# Patient Record
Sex: Female | Born: 1980 | Race: White | Hispanic: No | Marital: Married | Smoking: Never smoker
Health system: Southern US, Community
[De-identification: ages and names within clinical notes are randomized; demographics above are authoritative.]

## PROBLEM LIST (undated history)

## (undated) DIAGNOSIS — F32A Depression, unspecified: Secondary | ICD-10-CM

## (undated) DIAGNOSIS — F419 Anxiety disorder, unspecified: Secondary | ICD-10-CM

## (undated) HISTORY — PX: HERNIA REPAIR: SHX51

## (undated) HISTORY — DX: Depression, unspecified: F32.A

## (undated) HISTORY — DX: Anxiety disorder, unspecified: F41.9

---

## 2019-03-08 NOTE — L&D Delivery Note (Signed)
Delivery Note Called to room and patient complete and pushing. At 8:17 AM a viable female was delivered via Vaginal, Spontaneous (Presentation:   Occiput Anterior).  APGAR: 8, 9; weight pending.   Placenta status: Spontaneous, Intact.  Cord: 3 vessels with the following complications: None.   Anesthesia: Epidural Episiotomy: None Lacerations: bilateral deep Sulcus Suture Repair: 3.0 vicryl Est. Blood Loss (mL): 610  TXA given postpartum given continued bleeding.  Mom to postpartum.  Baby to Couplet care / Skin to Skin.  Alric Seton 03/02/2020, 8:44 AM

## 2019-07-02 LAB — OB RESULTS CONSOLE PLATELET COUNT: Platelets: 246

## 2019-07-02 LAB — OB RESULTS CONSOLE RPR: RPR: NONREACTIVE

## 2019-07-02 LAB — OB RESULTS CONSOLE HIV ANTIBODY (ROUTINE TESTING): HIV: NONREACTIVE

## 2019-07-02 LAB — OB RESULTS CONSOLE ABO/RH: RH Type: POSITIVE

## 2019-07-02 LAB — OB RESULTS CONSOLE HEPATITIS B SURFACE ANTIGEN: Hepatitis B Surface Ag: NEGATIVE

## 2019-07-02 LAB — OB RESULTS CONSOLE HGB/HCT, BLOOD
HCT: 47 — AB (ref 29–41)
Hemoglobin: 15.2

## 2019-07-02 LAB — OB RESULTS CONSOLE GC/CHLAMYDIA
Chlamydia: NEGATIVE
Gonorrhea: NEGATIVE

## 2019-07-02 LAB — OB RESULTS CONSOLE RUBELLA ANTIBODY, IGM: Rubella: IMMUNE

## 2019-07-02 LAB — OB RESULTS CONSOLE VARICELLA ZOSTER ANTIBODY, IGG: Varicella: IMMUNE

## 2019-07-02 LAB — HEPATITIS C ANTIBODY: HCV Ab: NEGATIVE

## 2019-07-02 LAB — CYSTIC FIBROSIS DIAGNOSTIC STUDY: Interpretation-CFDNA:: NEGATIVE

## 2019-07-02 LAB — OB RESULTS CONSOLE ANTIBODY SCREEN: Antibody Screen: NEGATIVE

## 2019-12-05 LAB — GLUCOSE TOLERANCE, 1 HOUR: Glucose, 1 Hour GTT: 167

## 2019-12-16 LAB — GLUCOSE TOLERANCE, 3 HOURS
Glucose, GTT - 1 Hour: 96 (ref ?–200)
Glucose, GTT - 2 Hour: 92 (ref ?–140)
Glucose, GTT - 3 Hour: 75 mg/dL (ref ?–140)
Glucose, GTT - Fasting: 78 mg/dL — AB (ref 80–110)

## 2019-12-16 LAB — OB RESULTS CONSOLE HGB/HCT, BLOOD
HCT: 39 (ref 29–41)
Hemoglobin: 12.8

## 2020-01-08 ENCOUNTER — Encounter: Payer: Self-pay | Admitting: *Deleted

## 2020-01-14 ENCOUNTER — Encounter: Payer: Self-pay | Admitting: General Practice

## 2020-01-16 ENCOUNTER — Encounter: Payer: Self-pay | Admitting: *Deleted

## 2020-01-22 ENCOUNTER — Encounter: Payer: Self-pay | Admitting: *Deleted

## 2020-01-22 ENCOUNTER — Ambulatory Visit (INDEPENDENT_AMBULATORY_CARE_PROVIDER_SITE_OTHER): Payer: Medicaid Other | Admitting: Obstetrics & Gynecology

## 2020-01-22 ENCOUNTER — Encounter: Payer: Self-pay | Admitting: Obstetrics & Gynecology

## 2020-01-22 ENCOUNTER — Other Ambulatory Visit: Payer: Self-pay

## 2020-01-22 VITALS — BP 113/86 | HR 100 | Ht 64.0 in | Wt 178.0 lb

## 2020-01-22 DIAGNOSIS — O09899 Supervision of other high risk pregnancies, unspecified trimester: Secondary | ICD-10-CM | POA: Insufficient documentation

## 2020-01-22 DIAGNOSIS — O09523 Supervision of elderly multigravida, third trimester: Secondary | ICD-10-CM | POA: Diagnosis not present

## 2020-01-22 DIAGNOSIS — F429 Obsessive-compulsive disorder, unspecified: Secondary | ICD-10-CM | POA: Insufficient documentation

## 2020-01-22 DIAGNOSIS — Z98891 History of uterine scar from previous surgery: Secondary | ICD-10-CM | POA: Insufficient documentation

## 2020-01-22 DIAGNOSIS — O0990 Supervision of high risk pregnancy, unspecified, unspecified trimester: Secondary | ICD-10-CM | POA: Diagnosis not present

## 2020-01-22 DIAGNOSIS — F419 Anxiety disorder, unspecified: Secondary | ICD-10-CM | POA: Insufficient documentation

## 2020-01-22 DIAGNOSIS — O34219 Maternal care for unspecified type scar from previous cesarean delivery: Secondary | ICD-10-CM

## 2020-01-22 NOTE — Progress Notes (Signed)
  Subjective:Transfer from [facility]    Candice Downs is a [id number] [redacted]w[redacted]d being seen today for her first obstetrical visit.  Her obstetrical history is significant for advanced maternal age and previous cesarean for breech. Patient does intend to breast feed. Pregnancy history fully reviewed.  Patient reports no complaints.  Vitals:   01/22/20 0957 01/22/20 0958  BP: 113/86   Pulse: 100   Weight: 178 lb (80.7 kg)   Height:  5\' 4"  (1.626 m)    HISTORY: OB History  Gravida Para Term Preterm AB Living  4 1 1  0 2 1  SAB TAB Ectopic Multiple Live Births  2 0 0 0 2    # Outcome Date GA Lbr Len/2nd Weight Sex Delivery Anes PTL Lv  4 Current           3 SAB 01/2018 [redacted]w[redacted]d         2 SAB 09/2017 [redacted]w[redacted]d         1 Term      CS-LTranv      Past Medical History:  Diagnosis Date  . Anxiety   . Depression    History reviewed. No pertinent surgical history. History reviewed. No pertinent family history.   Exam    Uterus:     Pelvic Exam:                                    Skin: normal coloration and turgor, no rashes    Neurologic: oriented, normal mood   Extremities: normal strength, tone, and muscle mass   HEENT PERRLA       Neck supple   Cardiovascular: regular rate and rhythm   Respiratory:  appears well, vitals normal, no respiratory distress, acyanotic, normal RR   Abdomen: gravid          Assessment:    Pregnancy: 10/2017 Patient Active Problem List   Diagnosis Date Noted  . Supervision of high risk pregnancy, antepartum 01/22/2020  . Short interval between pregnancies affecting pregnancy, antepartum 01/22/2020  . Anxiety 01/22/2020  . OCD (obsessive compulsive disorder) 01/22/2020        Plan:     Initial labs reviewed Prenatal vitamins. Problem list reviewed and updated. Genetic Screening discussed results reviewed.  Ultrasound discussed; fetal survey: ordered.  Follow up in 2 weeks. 50% of 30 min visit spent on counseling and coordination  of care.  Patient counseled repeat CS vs TOLAC and she signed consent for TOLAC   01/24/2020 01/22/2020

## 2020-01-22 NOTE — Patient Instructions (Signed)
Vaginal Birth After Cesarean Delivery  Vaginal birth after cesarean delivery (VBAC) is giving birth vaginally after previously delivering a baby through a cesarean section (C-section). A VBAC may be a safe option for you, depending on your health and other factors. It is important to discuss VBAC with your health care provider early in your pregnancy so you can understand the risks, benefits, and options. Having these discussions early will give you time to make your birth plan. Who are the best candidates for VBAC? The best candidates for VBAC are women who:  Have had one or two prior cesarean deliveries, and the incision made during the delivery was horizontal (low transverse).  Do not have a vertical (classical) scar on their uterus.  Have not had a tear in the wall of their uterus (uterine rupture).  Plan to have more pregnancies. A VBAC is also more likely to be successful:  In women who have previously given birth vaginally.  When labor starts by itself (spontaneously) before the due date. What are the benefits of VBAC? The benefits of delivering your baby vaginally instead of by a cesarean delivery include:  A shorter hospital stay.  A faster recovery time.  Less pain.  Avoiding risks associated with major surgery, such as infection and blood clots.  Less blood loss and less need for donated blood (transfusions). What are the risks of VBAC? The main risk of attempting a VBAC is that it may fail, forcing your health care provider to deliver your baby by a C-section. Other risks are rare and include:  Tearing (rupture) of the scar from a past cesarean delivery.  Other risks associated with vaginal deliveries. If a repeat cesarean delivery is needed, the risks include:  Blood loss.  Infection.  Blood clot.  Damage to surrounding organs.  Removal of the uterus (hysterectomy), if it is damaged.  Placenta problems in future pregnancies. What else should I know  about my options? Delivering a baby through a VBAC is similar to having a normal spontaneous vaginal delivery. Therefore, it is safe:  To try with twins.  For your health care provider to try to turn the baby from a breech position (external cephalic version) during labor.  With epidural analgesia for pain relief. Consider where you would like to deliver your baby. VBAC should be attempted in facilities where an emergency cesarean delivery can be performed. VBAC is not recommended for home births. Any changes in your health or your baby's health during your pregnancy may make it necessary to change your initial decision about VBAC. Your health care provider may recommend that you do not attempt a VBAC if:  Your baby's suspected weight is 8.8 lb (4 kg) or more.  You have preeclampsia. This is a condition that causes high blood pressure along with other symptoms, such as swelling and headaches.  You will have VBAC less than 19 months after your cesarean delivery.  You are past your due date.  You need to have labor started (induced) because your cervix is not ready for labor (unfavorable). Where to find more information  American Pregnancy Association: americanpregnancy.org  American Congress of Obstetricians and Gynecologists: acog.org Summary  Vaginal birth after cesarean delivery (VBAC) is giving birth vaginally after previously delivering a baby through a cesarean section (C-section). A VBAC may be a safe option for you, depending on your health and other factors.  Discuss VBAC with your health care provider early in your pregnancy so you can understand the risks, benefits, options, and   have plenty of time to make your birth plan.  The main risk of attempting a VBAC is that it may fail, forcing your health care provider to deliver your baby by a C-section. Other risks are rare. This information is not intended to replace advice given to you by your health care provider. Make sure  you discuss any questions you have with your health care provider. Document Revised: 06/19/2018 Document Reviewed: 05/31/2016 Elsevier Patient Education  2020 Elsevier Inc.  

## 2020-01-23 LAB — HIV ANTIBODY (ROUTINE TESTING W REFLEX): HIV Screen 4th Generation wRfx: NONREACTIVE

## 2020-01-23 LAB — RPR: RPR Ser Ql: NONREACTIVE

## 2020-02-06 ENCOUNTER — Ambulatory Visit (INDEPENDENT_AMBULATORY_CARE_PROVIDER_SITE_OTHER): Payer: Medicaid Other | Admitting: Nurse Practitioner

## 2020-02-06 ENCOUNTER — Other Ambulatory Visit: Payer: Self-pay

## 2020-02-06 ENCOUNTER — Other Ambulatory Visit (HOSPITAL_COMMUNITY)
Admission: RE | Admit: 2020-02-06 | Discharge: 2020-02-06 | Disposition: A | Discharge status: Home / Self Care | Payer: Medicaid Other | Source: Ambulatory Visit | Attending: Nurse Practitioner | Admitting: Nurse Practitioner

## 2020-02-06 VITALS — BP 121/86 | HR 89 | Wt 179.0 lb

## 2020-02-06 DIAGNOSIS — F419 Anxiety disorder, unspecified: Secondary | ICD-10-CM

## 2020-02-06 DIAGNOSIS — O09899 Supervision of other high risk pregnancies, unspecified trimester: Secondary | ICD-10-CM | POA: Diagnosis not present

## 2020-02-06 DIAGNOSIS — O0990 Supervision of high risk pregnancy, unspecified, unspecified trimester: Secondary | ICD-10-CM

## 2020-02-06 DIAGNOSIS — O34219 Maternal care for unspecified type scar from previous cesarean delivery: Secondary | ICD-10-CM

## 2020-02-06 NOTE — Progress Notes (Signed)
° ° °  Subjective:  Candice Downs is a 39 y.o. G4P1021 at [redacted]w[redacted]d being seen today for ongoing prenatal care.  She is currently monitored for the following issues for this high-risk pregnancy and has Supervision of high risk pregnancy, antepartum; Short interval between pregnancies affecting pregnancy, antepartum; Anxiety; OCD (obsessive compulsive disorder); Previous cesarean delivery affecting pregnancy; and Multigravida of advanced maternal age in third trimester on their problem list.  Patient reports no complaints.  Contractions: Irritability. Vag. Bleeding: None.  Movement: Present. Denies leaking of fluid.   The following portions of the patient's history were reviewed and updated as appropriate: allergies, current medications, past family history, past medical history, past social history, past surgical history and problem list. Problem list updated.  Objective:   Vitals:   02/06/20 1551  BP: 121/86  Pulse: 89  Weight: 179 lb (81.2 kg)    Fetal Status: Fetal Heart Rate (bpm): 136 Fundal Height: 36 cm Movement: Present     General:  Alert, oriented and cooperative. Patient is in no acute distress.  Skin: Skin is warm and dry. No rash noted.   Cardiovascular: Normal heart rate noted  Respiratory: Normal respiratory effort, no problems with respiration noted  Abdomen: Soft, gravid, appropriate for gestational age. Pain/Pressure: Absent     Pelvic:  Cervical exam deferred        Extremities: Normal range of motion.  Edema: None  Mental Status: Normal mood and affect. Normal behavior. Normal judgment and thought content.   Urinalysis:      Assessment and Plan:  Pregnancy: G4P1021 at [redacted]w[redacted]d  1. Supervision of high risk pregnancy, antepartum Vaginal swabs done Having some braxton hicks at night for an hour but then they resolve  2. Short interval between pregnancies affecting pregnancy, antepartum  - GC/Chlamydia probe amp ([place])not at [facility] - Strep Gp B Culture+Rflx  3.  Previous cesarean delivery affecting pregnancy Signed consent for VBAC  4. Anxiety On abilify and Prozac and was in her last pregnancy as well Has enough meds for now. Is seeing a family practice doctor tomorrow for her child and plans to go to that practice for her continued refills after the pregnancy Advised to be aware of risk of PP Depression and to call the office at any time if symptoms are worrisome for her.  Preterm labor symptoms and general obstetric precautions including but not limited to vaginal bleeding, contractions, leaking of fluid and fetal movement were reviewed in detail with the patient. Please refer to After Visit Summary for other counseling recommendations.  Return in about 1 week (around 02/13/2020) for in person ROB.  Nolene Bernheim, RN, MSN, NP-BC Nurse Practitioner, [facility] for [employer], [facility] 02/06/2020 4:24 PM

## 2020-02-06 NOTE — Patient Instructions (Signed)

## 2020-02-07 ENCOUNTER — Ambulatory Visit: Payer: Medicaid Other | Attending: Obstetrics & Gynecology

## 2020-02-07 ENCOUNTER — Encounter: Payer: Self-pay | Admitting: *Deleted

## 2020-02-07 ENCOUNTER — Ambulatory Visit: Payer: Medicaid Other | Admitting: *Deleted

## 2020-02-07 DIAGNOSIS — O0990 Supervision of high risk pregnancy, unspecified, unspecified trimester: Secondary | ICD-10-CM | POA: Diagnosis not present

## 2020-02-07 DIAGNOSIS — O09899 Supervision of other high risk pregnancies, unspecified trimester: Secondary | ICD-10-CM | POA: Diagnosis present

## 2020-02-07 LAB — GC/CHLAMYDIA PROBE AMP (~~LOC~~) NOT AT ARMC
Chlamydia: NEGATIVE
Comment: NEGATIVE
Comment: NORMAL
Neisseria Gonorrhea: NEGATIVE

## 2020-02-11 ENCOUNTER — Encounter: Payer: Self-pay | Admitting: Nurse Practitioner

## 2020-02-11 DIAGNOSIS — O99820 Streptococcus B carrier state complicating pregnancy: Secondary | ICD-10-CM | POA: Insufficient documentation

## 2020-02-11 LAB — STREP GP B CULTURE+RFLX: Strep Gp B Culture+Rflx: POSITIVE — AB

## 2020-02-11 LAB — STREP GP B SUSCEPTIBILITY

## 2020-02-13 ENCOUNTER — Ambulatory Visit (INDEPENDENT_AMBULATORY_CARE_PROVIDER_SITE_OTHER): Payer: Medicaid Other | Admitting: Obstetrics & Gynecology

## 2020-02-13 ENCOUNTER — Other Ambulatory Visit: Payer: Self-pay

## 2020-02-13 VITALS — BP 117/82 | HR 90 | Wt 182.3 lb

## 2020-02-13 DIAGNOSIS — O0990 Supervision of high risk pregnancy, unspecified, unspecified trimester: Secondary | ICD-10-CM

## 2020-02-13 DIAGNOSIS — O34219 Maternal care for unspecified type scar from previous cesarean delivery: Secondary | ICD-10-CM

## 2020-02-13 NOTE — Progress Notes (Signed)
Patient ID: Candice Downs, female   DOB: 1980/10/12, 39 y.o.   MRN: [id number]    PRENATAL VISIT NOTE  Subjective:  Candice Downs is a 39 y.o. G4P1021 at [redacted]w[redacted]d being seen today for ongoing prenatal care.  She is currently monitored for the following issues for this high-risk pregnancy and has Supervision of high risk pregnancy, antepartum; Short interval between pregnancies affecting pregnancy, antepartum; Anxiety; OCD (obsessive compulsive disorder); Previous cesarean delivery affecting pregnancy; Multigravida of advanced maternal age in third trimester; and GBS (group B Streptococcus carrier), +RV culture, currently pregnant on their problem list.  Patient reports no complaints.  Contractions: Irritability. Vag. Bleeding: None.  Movement: Present. Denies leaking of fluid.   The following portions of the patient's history were reviewed and updated as appropriate: allergies, current medications, past family history, past medical history, past social history, past surgical history and problem list.   Objective:   Vitals:   02/13/20 1533  BP: 117/82  Pulse: 90  Weight: 182 lb 4.8 oz (82.7 kg)    Fetal Status: Fetal Heart Rate (bpm): 128   Movement: Present     General:  Alert, oriented and cooperative. Patient is in no acute distress.  Skin: Skin is warm and dry. No rash noted.   Cardiovascular: Normal heart rate noted  Respiratory: Normal respiratory effort, no problems with respiration noted  Abdomen: Soft, gravid, appropriate for gestational age.  Pain/Pressure: Present     Pelvic: Cervical exam deferred        Extremities: Normal range of motion.  Edema: None  Mental Status: Normal mood and affect. Normal behavior. Normal judgment and thought content.   Assessment and Plan:  Pregnancy: G4P1021 at [redacted]w[redacted]d 1. Supervision of high risk pregnancy, antepartum F/u 1 week  2. Previous cesarean delivery affecting pregnancy Desires tolac  Term labor symptoms and general obstetric  precautions including but not limited to vaginal bleeding, contractions, leaking of fluid and fetal movement were reviewed in detail with the patient. Please refer to After Visit Summary for other counseling recommendations.   Return in 1 week (on 02/20/2020).  No future appointments.  Candice Chamber, MD

## 2020-02-13 NOTE — Patient Instructions (Signed)
Preparing for Vaginal Birth After Cesarean Delivery Vaginal birth after cesarean delivery (VBAC) is giving birth vaginally after previously delivering a baby through a cesarean section (C-section). You and your health are provider will discuss your options and whether you may be a good candidate for VBAC. What are my options? After a cesarean delivery, your options for future deliveries may include:  Scheduled repeat cesarean delivery. This is done in a hospital with an operating room.  Trial of labor after cesarean (TOLAC). A successful TOLAC results in a vaginal delivery. If it is not successful, you will need to have a cesarean delivery. TOLAC should be attempted in facilities where an emergency cesarean delivery can be performed. It should not be done as a home birth. Talk with your health care provider about the risks and benefits of each option early in your pregnancy. The best option for you will depend on your preferences and your overall health as well as your baby's. What should I know about my past cesarean delivery? It is important to know what type of incision was made in your uterus in a past cesarean delivery. The type of incision can affect the success of your TOLAC. Types of incisions include:  Low transverse. This is a side-to-side cut low on your uterus. The scar on your skin looks like a horizontal line just above your pubic area. This type of cut is the most common and makes you a good candidate for TOLAC.  Low vertical. This is an up-and-down cut low on your uterus. The scar on your skin looks like a vertical line between your pubic area and belly button. This type of cut puts you at higher risk for problems during TOLAC.  High vertical or classical. This is an up-and-down cut high on your uterus. The scar on your skin looks like a vertical line that runs over the top of your belly button. This type of cut has the highest risk for problems and usually means that TOLAC is not an  option. When is VBAC not an option? As you progress through your pregnancy, circumstances may change and you may need to reconsider your options. Your situation may also change even as you begin TOLAC. Your health care provider may not want you to attempt a VBAC if you:  Need to have labor started (induced) because your cervix is not ready for labor.  Have never had a vaginal delivery.  Have had more than two cesarean deliveries.  Are overdue.  Are pregnant with a very large baby.  Have a condition that causes high blood pressure (preeclampsia). Questions to ask your health care provider  Am I a good candidate for TOLAC?  What are my chances of a successful vaginal delivery?  Is my preferred birth location equipped for a TOLAC?  What are my pain management options during a TOLAC? Where to find more information  American Congress of Obstetricians and Gynecologists: www.acog.org  American College of Nurse-Midwives: www.midwife.org Summary  Vaginal birth after cesarean delivery (VBAC) is giving birth vaginally after previously delivering a baby through a cesarean section (C-section).  VBAC may be a safe and appropriate option for you depending on your medical history and other risk factors. Talk with your health care provider about the options available to you, and the risks and benefits of each early in your pregnancy.  TOLAC should be attempted in facilities where emergency cesarean section procedures can be performed. This information is not intended to replace advice given to you by   your health care provider. Make sure you discuss any questions you have with your health care provider. Document Revised: 06/19/2018 Document Reviewed: 06/02/2016 Elsevier Patient Education  2020 Elsevier Inc.  

## 2020-02-20 ENCOUNTER — Ambulatory Visit (INDEPENDENT_AMBULATORY_CARE_PROVIDER_SITE_OTHER): Payer: Medicaid Other | Admitting: Family Medicine

## 2020-02-20 ENCOUNTER — Other Ambulatory Visit: Payer: Self-pay

## 2020-02-20 VITALS — BP 131/89 | HR 104 | Wt 174.4 lb

## 2020-02-20 DIAGNOSIS — O34219 Maternal care for unspecified type scar from previous cesarean delivery: Secondary | ICD-10-CM

## 2020-02-20 DIAGNOSIS — O09523 Supervision of elderly multigravida, third trimester: Secondary | ICD-10-CM

## 2020-02-20 DIAGNOSIS — O99820 Streptococcus B carrier state complicating pregnancy: Secondary | ICD-10-CM

## 2020-02-20 DIAGNOSIS — F419 Anxiety disorder, unspecified: Secondary | ICD-10-CM

## 2020-02-20 DIAGNOSIS — O0990 Supervision of high risk pregnancy, unspecified, unspecified trimester: Secondary | ICD-10-CM

## 2020-02-20 NOTE — Progress Notes (Signed)
   Subjective:  Candice Downs is a 39 y.o. G4P1021 at [redacted]w[redacted]d being seen today for ongoing prenatal care.  She is currently monitored for the following issues for this high-risk pregnancy and has Supervision of high risk pregnancy, antepartum; Short interval between pregnancies affecting pregnancy, antepartum; Anxiety; OCD (obsessive compulsive disorder); Previous cesarean delivery affecting pregnancy; Multigravida of advanced maternal age in third trimester; and GBS (group B Streptococcus carrier), +RV culture, currently pregnant on their problem list.  Patient reports no complaints.  Contractions: Irritability. Vag. Bleeding: None.  Movement: Present. Denies leaking of fluid.   The following portions of the patient's history were reviewed and updated as appropriate: allergies, current medications, past family history, past medical history, past social history, past surgical history and problem list. Problem list updated.  Objective:   Vitals:   02/20/20 1036  BP: 131/89  Pulse: (!) 104  Weight: 174 lb 6.4 oz (79.1 kg)    Fetal Status: Fetal Heart Rate (bpm): 156   Movement: Present  Presentation: Vertex  General:  Alert, oriented and cooperative. Patient is in no acute distress.  Skin: Skin is warm and dry. No rash noted.   Cardiovascular: Normal heart rate noted  Respiratory: Normal respiratory effort, no problems with respiration noted  Abdomen: Soft, gravid, appropriate for gestational age. Pain/Pressure: Present     Pelvic: Vag. Bleeding: None     Cervical exam performed Dilation: Closed      Extremities: Normal range of motion.  Edema: None  Mental Status: Normal mood and affect. Normal behavior. Normal judgment and thought content.   Urinalysis:      Assessment and Plan:  Pregnancy: G4P1021 at [redacted]w[redacted]d  1. Supervision of high risk pregnancy, antepartum BP and FHR normal Discussed timing of IOL if she were to go post dates and some evidence showing improved chances of TOLAC not  going too far PD OK with IOL at [redacted]w[redacted]d, IOL form sent IOL orders placed Would like post-placental hormonal IUD placed after delivery  2. Previous cesarean delivery affecting pregnancy VBAC consent already signed  3. Anxiety   4. GBS (group B Streptococcus carrier), +RV culture, currently pregnant Per chart reported allergy is hives and urticaria, ordered for Vanc  5. Multigravida of advanced maternal age in third trimester   Term labor symptoms and general obstetric precautions including but not limited to vaginal bleeding, contractions, leaking of fluid and fetal movement were reviewed in detail with the patient. Please refer to After Visit Summary for other counseling recommendations.  Return in 1 week (on 02/27/2020).   Venora Maples, MD

## 2020-02-20 NOTE — Patient Instructions (Signed)
 Third Trimester of Pregnancy The third trimester is from week 28 through week 40 (months 7 through 9). The third trimester is a time when the unborn baby (fetus) is growing rapidly. At the end of the ninth month, the fetus is about 20 inches in length and weighs 6-10 pounds. Body changes during your third trimester Your body will continue to go through many changes during pregnancy. The changes vary from woman to woman. During the third trimester:  Your weight will continue to increase. You can expect to gain 25-35 pounds (11-16 kg) by the end of the pregnancy.  You may begin to get stretch marks on your hips, abdomen, and breasts.  You may urinate more often because the fetus is moving lower into your pelvis and pressing on your bladder.  You may develop or continue to have heartburn. This is caused by increased hormones that slow down muscles in the digestive tract.  You may develop or continue to have constipation because increased hormones slow digestion and cause the muscles that push waste through your intestines to relax.  You may develop hemorrhoids. These are swollen veins (varicose veins) in the rectum that can itch or be painful.  You may develop swollen, bulging veins (varicose veins) in your legs.  You may have increased body aches in the pelvis, back, or thighs. This is due to weight gain and increased hormones that are relaxing your joints.  You may have changes in your hair. These can include thickening of your hair, rapid growth, and changes in texture. Some women also have hair loss during or after pregnancy, or hair that feels dry or thin. Your hair will most likely return to normal after your baby is born.  Your breasts will continue to grow and they will continue to become tender. A yellow fluid (colostrum) may leak from your breasts. This is the first milk you are producing for your baby.  Your belly button may stick out.  You may notice more swelling in your  hands, face, or ankles.  You may have increased tingling or numbness in your hands, arms, and legs. The skin on your belly may also feel numb.  You may feel short of breath because of your expanding uterus.  You may have more problems sleeping. This can be caused by the size of your belly, increased need to urinate, and an increase in your body's metabolism.  You may notice the fetus "dropping," or moving lower in your abdomen (lightening).  You may have increased vaginal discharge.  You may notice your joints feel loose and you may have pain around your pelvic bone. What to expect at prenatal visits You will have prenatal exams every 2 weeks until week 36. Then you will have weekly prenatal exams. During a routine prenatal visit:  You will be weighed to make sure you and the baby are growing normally.  Your blood pressure will be taken.  Your abdomen will be measured to track your baby's growth.  The fetal heartbeat will be listened to.  Any test results from the previous visit will be discussed.  You may have a cervical check near your due date to see if your cervix has softened or thinned (effaced).  You will be tested for Group B streptococcus. This happens between 35 and 37 weeks. Your health care provider may ask you:  What your birth plan is.  How you are feeling.  If you are feeling the baby move.  If you have had any   abnormal symptoms, such as leaking fluid, bleeding, severe headaches, or abdominal cramping.  If you are using any tobacco products, including cigarettes, chewing tobacco, and electronic cigarettes.  If you have any questions. Other tests or screenings that may be performed during your third trimester include:  Blood tests that check for low iron levels (anemia).  Fetal testing to check the health, activity level, and growth of the fetus. Testing is done if you have certain medical conditions or if there are problems during the  pregnancy.  Nonstress test (NST). This test checks the health of your baby to make sure there are no signs of problems, such as the baby not getting enough oxygen. During this test, a belt is placed around your belly. The baby is made to move, and its heart rate is monitored during movement. What is false labor? False labor is a condition in which you feel small, irregular tightenings of the muscles in the womb (contractions) that usually go away with rest, changing position, or drinking water. These are called Braxton Hicks contractions. Contractions may last for hours, days, or even weeks before true labor sets in. If contractions come at regular intervals, become more frequent, increase in intensity, or become painful, you should see your health care provider. What are the signs of labor?  Abdominal cramps.  Regular contractions that start at 10 minutes apart and become stronger and more frequent with time.  Contractions that start on the top of the uterus and spread down to the lower abdomen and back.  Increased pelvic pressure and dull back pain.  A watery or bloody mucus discharge that comes from the vagina.  Leaking of amniotic fluid. This is also known as your "water breaking." It could be a slow trickle or a gush. Let your health care provider know if it has a color or strange odor. If you have any of these signs, call your health care provider right away, even if it is before your due date. Follow these instructions at home: Medicines  Follow your health care provider's instructions regarding medicine use. Specific medicines may be either safe or unsafe to take during pregnancy.  Take a prenatal vitamin that contains at least 600 micrograms (mcg) of folic acid.  If you develop constipation, try taking a stool softener if your health care provider approves. Eating and drinking   Eat a balanced diet that includes fresh fruits and vegetables, whole grains, good sources of protein  such as meat, eggs, or tofu, and low-fat dairy. Your health care provider will help you determine the amount of weight gain that is right for you.  Avoid raw meat and uncooked cheese. These carry germs that can cause birth defects in the baby.  If you have low calcium intake from food, talk to your health care provider about whether you should take a daily calcium supplement.  Eat four or five small meals rather than three large meals a day.  Limit foods that are high in fat and processed sugars, such as fried and sweet foods.  To prevent constipation: ? Drink enough fluid to keep your urine clear or pale yellow. ? Eat foods that are high in fiber, such as fresh fruits and vegetables, whole grains, and beans. Activity  Exercise only as directed by your health care provider. Most women can continue their usual exercise routine during pregnancy. Try to exercise for 30 minutes at least 5 days a week. Stop exercising if you experience uterine contractions.  Avoid heavy lifting.    Do not exercise in extreme heat or humidity, or at high altitudes.  Wear low-heel, comfortable shoes.  Practice good posture.  You may continue to have sex unless your health care provider tells you otherwise. Relieving pain and discomfort  Take frequent breaks and rest with your legs elevated if you have leg cramps or low back pain.  Take warm sitz baths to soothe any pain or discomfort caused by hemorrhoids. Use hemorrhoid cream if your health care provider approves.  Wear a good support bra to prevent discomfort from breast tenderness.  If you develop varicose veins: ? Wear support pantyhose or compression stockings as told by your healthcare provider. ? Elevate your feet for 15 minutes, 3-4 times a day. Prenatal care  Write down your questions. Take them to your prenatal visits.  Keep all your prenatal visits as told by your health care provider. This is important. Safety  Wear your seat belt at  all times when driving.  Make a list of emergency phone numbers, including numbers for family, friends, the hospital, and police and fire departments. General instructions  Avoid cat litter boxes and soil used by cats. These carry germs that can cause birth defects in the baby. If you have a cat, ask someone to clean the litter box for you.  Do not travel far distances unless it is absolutely necessary and only with the approval of your health care provider.  Do not use hot tubs, steam rooms, or saunas.  Do not drink alcohol.  Do not use any products that contain nicotine or tobacco, such as cigarettes and e-cigarettes. If you need help quitting, ask your health care provider.  Do not use any medicinal herbs or unprescribed drugs. These chemicals affect the formation and growth of the baby.  Do not douche or use tampons or scented sanitary pads.  Do not cross your legs for long periods of time.  To prepare for the arrival of your baby: ? Take prenatal classes to understand, practice, and ask questions about labor and delivery. ? Make a trial run to the hospital. ? Visit the hospital and tour the maternity area. ? Arrange for maternity or paternity leave through employers. ? Arrange for family and friends to take care of pets while you are in the hospital. ? Purchase a rear-facing car seat and make sure you know how to install it in your car. ? Pack your hospital bag. ? Prepare the baby's nursery. Make sure to remove all pillows and stuffed animals from the baby's crib to prevent suffocation.  Visit your dentist if you have not gone during your pregnancy. Use a soft toothbrush to brush your teeth and be gentle when you floss. Contact a health care provider if:  You are unsure if you are in labor or if your water has broken.  You become dizzy.  You have mild pelvic cramps, pelvic pressure, or nagging pain in your abdominal area.  You have lower back pain.  You have persistent  nausea, vomiting, or diarrhea.  You have an unusual or bad smelling vaginal discharge.  You have pain when you urinate. Get help right away if:  Your water breaks before 37 weeks.  You have regular contractions less than 5 minutes apart before 37 weeks.  You have a fever.  You are leaking fluid from your vagina.  You have spotting or bleeding from your vagina.  You have severe abdominal pain or cramping.  You have rapid weight loss or weight gain.  You   have shortness of breath with chest pain.  You notice sudden or extreme swelling of your face, hands, ankles, feet, or legs.  Your baby makes fewer than 10 movements in 2 hours.  You have severe headaches that do not go away when you take medicine.  You have vision changes. Summary  The third trimester is from week 28 through week 40, months 7 through 9. The third trimester is a time when the unborn baby (fetus) is growing rapidly.  During the third trimester, your discomfort may increase as you and your baby continue to gain weight. You may have abdominal, leg, and back pain, sleeping problems, and an increased need to urinate.  During the third trimester your breasts will keep growing and they will continue to become tender. A yellow fluid (colostrum) may leak from your breasts. This is the first milk you are producing for your baby.  False labor is a condition in which you feel small, irregular tightenings of the muscles in the womb (contractions) that eventually go away. These are called Braxton Hicks contractions. Contractions may last for hours, days, or even weeks before true labor sets in.  Signs of labor can include: abdominal cramps; regular contractions that start at 10 minutes apart and become stronger and more frequent with time; watery or bloody mucus discharge that comes from the vagina; increased pelvic pressure and dull back pain; and leaking of amniotic fluid. This information is not intended to replace advice  given to you by your health care provider. Make sure you discuss any questions you have with your health care provider. Document Revised: 06/14/2018 Document Reviewed: 03/29/2016 Else[employer] Patient Education  2020 [employer]   Contraception Choices Contraception, also called birth control, refers to methods or devices that prevent pregnancy. Hormonal methods Contraceptive implant  A contraceptive implant is a thin, plastic tube that contains a hormone. It is inserted into the upper part of the arm. It can remain in place for up to 3 years. Progestin-only injections Progestin-only injections are injections of progestin, a synthetic form of the hormone progesterone. They are given every 3 months by a health care provider. Birth control pills  Birth control pills are pills that contain hormones that prevent pregnancy. They must be taken once a day, preferably at the same time each day. Birth control patch  The birth control patch contains hormones that prevent pregnancy. It is placed on the skin and must be changed once a week for three weeks and removed on the fourth week. A prescription is needed to use this method of contraception. Vaginal ring  A vaginal ring contains hormones that prevent pregnancy. It is placed in the vagina for three weeks and removed on the fourth week. After that, the process is repeated with a new ring. A prescription is needed to use this method of contraception. Emergency contraceptive Emergency contraceptives prevent pregnancy after unprotected sex. They come in pill form and can be taken up to 5 days after sex. They work best the sooner they are taken after having sex. Most emergency contraceptives are available without a prescription. This method should not be used as your only form of birth control. Barrier methods Female condom  A female condom is a thin sheath that is worn over the penis during sex. Condoms keep sperm from going inside a woman's body. They can  be used with a spermicide to increase their effectiveness. They should be disposed after a single use. Female condom  A female condom is a soft,   loose-fitting sheath that is put into the vagina before sex. The condom keeps sperm from going inside a woman's body. They should be disposed after a single use. Diaphragm  A diaphragm is a soft, dome-shaped barrier. It is inserted into the vagina before sex, along with a spermicide. The diaphragm blocks sperm from entering the uterus, and the spermicide kills sperm. A diaphragm should be left in the vagina for 6-8 hours after sex and removed within 24 hours. A diaphragm is prescribed and fitted by a health care provider. A diaphragm should be replaced every 1-2 years, after giving birth, after gaining more than 15 lb (6.8 kg), and after pelvic surgery. Cervical cap  A cervical cap is a round, soft latex or plastic cup that fits over the cervix. It is inserted into the vagina before sex, along with spermicide. It blocks sperm from entering the uterus. The cap should be left in place for 6-8 hours after sex and removed within 48 hours. A cervical cap must be prescribed and fitted by a health care provider. It should be replaced every 2 years. Sponge  A sponge is a soft, circular piece of polyurethane foam with spermicide on it. The sponge helps block sperm from entering the uterus, and the spermicide kills sperm. To use it, you make it wet and then insert it into the vagina. It should be inserted before sex, left in for at least 6 hours after sex, and removed and thrown away within 30 hours. Spermicides Spermicides are chemicals that kill or block sperm from entering the cervix and uterus. They can come as a cream, jelly, suppository, foam, or tablet. A spermicide should be inserted into the vagina with an applicator at least 10-15 minutes before sex to allow time for it to work. The process must be repeated every time you have sex. Spermicides do not require  a prescription. Intrauterine contraception Intrauterine device (IUD) An IUD is a T-shaped device that is put in a woman's uterus. There are two types:  Hormone IUD.This type contains progestin, a synthetic form of the hormone progesterone. This type can stay in place for 3-5 years.  Copper IUD.This type is wrapped in copper wire. It can stay in place for 10 years.  Permanent methods of contraception Female tubal ligation In this method, a woman's fallopian tubes are sealed, tied, or blocked during surgery to prevent eggs from traveling to the uterus. Hysteroscopic sterilization In this method, a small, flexible insert is placed into each fallopian tube. The inserts cause scar tissue to form in the fallopian tubes and block them, so sperm cannot reach an egg. The procedure takes about 3 months to be effective. Another form of birth control must be used during those 3 months. Female sterilization This is a procedure to tie off the tubes that carry sperm (vasectomy). After the procedure, the man can still ejaculate fluid (semen). Natural planning methods Natural family planning In this method, a couple does not have sex on days when the woman could become pregnant. Calendar method This means keeping track of the length of each menstrual cycle, identifying the days when pregnancy can happen, and not having sex on those days. Ovulation method In this method, a couple avoids sex during ovulation. Symptothermal method This method involves not having sex during ovulation. The woman typically checks for ovulation by watching changes in her temperature and in the consistency of cervical mucus. Post-ovulation method In this method, a couple waits to have sex until after ovulation. Summary    Contraception, also called birth control, means methods or devices that prevent pregnancy.  Hormonal methods of contraception include implants, injections, pills, patches, vaginal rings, and emergency  contraceptives.  Barrier methods of contraception can include female condoms, female condoms, diaphragms, cervical caps, sponges, and spermicides.  There are two types of IUDs (intrauterine devices). An IUD can be put in a woman's uterus to prevent pregnancy for 3-5 years.  Permanent sterilization can be done through a procedure for males, females, or both.  Natural family planning methods involve not having sex on days when the woman could become pregnant. This information is not intended to replace advice given to you by your health care provider. Make sure you discuss any questions you have with your health care provider. Document Revised: 02/23/2017 Document Reviewed: 03/26/2016 Else[employer] Patient Education  2020 [employer]   Breastfeeding  Choosing to breastfeed is one of the best decisions you can make for yourself and your baby. A change in hormones during pregnancy causes your breasts to make breast milk in your milk-producing glands. Hormones prevent breast milk from being released before your baby is born. They also prompt milk flow after birth. Once breastfeeding has begun, thoughts of your baby, as well as his or her sucking or crying, can stimulate the release of milk from your milk-producing glands. Benefits of breastfeeding Research shows that breastfeeding offers many health benefits for infants and mothers. It also offers a cost-free and convenient way to feed your baby. For your baby  Your first milk (colostrum) helps your baby's digestive system to function better.  Special cells in your milk (antibodies) help your baby to fight off infections.  Breastfed babies are less likely to develop asthma, allergies, obesity, or type 2 diabetes. They are also at lower risk for sudden infant death syndrome (SIDS).  Nutrients in breast milk are better able to meet your baby's needs compared to infant formula.  Breast milk improves your baby's brain development. For  you  Breastfeeding helps to create a very special bond between you and your baby.  Breastfeeding is convenient. Breast milk costs nothing and is always available at the correct temperature.  Breastfeeding helps to burn calories. It helps you to lose the weight that you gained during pregnancy.  Breastfeeding makes your uterus return faster to its size before pregnancy. It also slows bleeding (lochia) after you give birth.  Breastfeeding helps to lower your risk of developing type 2 diabetes, osteoporosis, rheumatoid arthritis, cardiovascular disease, and breast, ovarian, uterine, and endometrial cancer later in life. Breastfeeding basics Starting breastfeeding  Find a comfortable place to sit or lie down, with your neck and back well-supported.  Place a pillow or a rolled-up blanket under your baby to bring him or her to the level of your breast (if you are seated). Nursing pillows are specially designed to help support your arms and your baby while you breastfeed.  Make sure that your baby's tummy (abdomen) is facing your abdomen.  Gently massage your breast. With your fingertips, massage from the outer edges of your breast inward toward the nipple. This encourages milk flow. If your milk flows slowly, you may need to continue this action during the feeding.  Support your breast with 4 fingers underneath and your thumb above your nipple (make the letter "C" with your hand). Make sure your fingers are well away from your nipple and your baby's mouth.  Stroke your baby's lips gently with your finger or nipple.  When your baby's mouth is open   wide enough, quickly bring your baby to your breast, placing your entire nipple and as much of the areola as possible into your baby's mouth. The areola is the colored area around your nipple. ? More areola should be visible above your baby's upper lip than below the lower lip. ? Your baby's lips should be opened and extended outward (flanged) to  ensure an adequate, comfortable latch. ? Your baby's tongue should be between his or her lower gum and your breast.  Make sure that your baby's mouth is correctly positioned around your nipple (latched). Your baby's lips should create a seal on your breast and be turned out (everted).  It is common for your baby to suck about 2-3 minutes in order to start the flow of breast milk. Latching Teaching your baby how to latch onto your breast properly is very important. An improper latch can cause nipple pain, decreased milk supply, and poor weight gain in your baby. Also, if your baby is not latched onto your nipple properly, he or she may swallow some air during feeding. This can make your baby fussy. Burping your baby when you switch breasts during the feeding can help to get rid of the air. However, teaching your baby to latch on properly is still the best way to prevent fussiness from swallowing air while breastfeeding. Signs that your baby has successfully latched onto your nipple  Silent tugging or silent sucking, without causing you pain. Infant's lips should be extended outward (flanged).  Swallowing heard between every 3-4 sucks once your milk has started to flow (after your let-down milk reflex occurs).  Muscle movement above and in front of his or her ears while sucking. Signs that your baby has not successfully latched onto your nipple  Sucking sounds or smacking sounds from your baby while breastfeeding.  Nipple pain. If you think your baby has not latched on correctly, slip your finger into the corner of your baby's mouth to break the suction and place it between your baby's gums. Attempt to start breastfeeding again. Signs of successful breastfeeding Signs from your baby  Your baby will gradually decrease the number of sucks or will completely stop sucking.  Your baby will fall asleep.  Your baby's body will relax.  Your baby will retain a small amount of milk in his or her  mouth.  Your baby will let go of your breast by himself or herself. Signs from you  Breasts that have increased in firmness, weight, and size 1-3 hours after feeding.  Breasts that are softer immediately after breastfeeding.  Increased milk volume, as well as a change in milk consistency and color by the fifth day of breastfeeding.  Nipples that are not sore, cracked, or bleeding. Signs that your baby is getting enough milk  Wetting at least 1-2 diapers during the first 24 hours after birth.  Wetting at least 5-6 diapers every 24 hours for the first week after birth. The urine should be clear or pale yellow by the age of 5 days.  Wetting 6-8 diapers every 24 hours as your baby continues to grow and develop.  At least 3 stools in a 24-hour period by the age of 5 days. The stool should be soft and yellow.  At least 3 stools in a 24-hour period by the age of 7 days. The stool should be seedy and yellow.  No loss of weight greater than 10% of birth weight during the first 3 days of life.  Average weight gain   of 4-7 oz (113-198 g) per week after the age of 4 days.  Consistent daily weight gain by the age of 5 days, without weight loss after the age of 2 weeks. After a feeding, your baby may spit up a small amount of milk. This is normal. Breastfeeding frequency and duration Frequent feeding will help you make more milk and can prevent sore nipples and extremely full breasts (breast engorgement). Breastfeed when you feel the need to reduce the fullness of your breasts or when your baby shows signs of hunger. This is called "breastfeeding on demand." Signs that your baby is hungry include:  Increased alertness, activity, or restlessness.  Movement of the head from side to side.  Opening of the mouth when the corner of the mouth or cheek is stroked (rooting).  Increased sucking sounds, smacking lips, cooing, sighing, or squeaking.  Hand-to-mouth movements and sucking on fingers or  hands.  Fussing or crying. Avoid introducing a pacifier to your baby in the first 4-6 weeks after your baby is born. After this time, you may choose to use a pacifier. Research has shown that pacifier use during the first year of a baby's life decreases the risk of sudden infant death syndrome (SIDS). Allow your baby to feed on each breast as long as he or she wants. When your baby unlatches or falls asleep while feeding from the first breast, offer the second breast. Because newborns are often sleepy in the first few weeks of life, you may need to awaken your baby to get him or her to feed. Breastfeeding times will vary from baby to baby. However, the following rules can serve as a guide to help you make sure that your baby is properly fed:  Newborns (babies 4 weeks of age or younger) may breastfeed every 1-3 hours.  Newborns should not go without breastfeeding for longer than 3 hours during the day or 5 hours during the night.  You should breastfeed your baby a minimum of 8 times in a 24-hour period. Breast milk pumping     Pumping and storing breast milk allows you to make sure that your baby is exclusively fed your breast milk, even at times when you are unable to breastfeed. This is especially important if you go back to work while you are still breastfeeding, or if you are not able to be present during feedings. Your lactation consultant can help you find a method of pumping that works best for you and give you guidelines about how long it is safe to store breast milk. Caring for your breasts while you breastfeed Nipples can become dry, cracked, and sore while breastfeeding. The following recommendations can help keep your breasts moisturized and healthy:  Avoid using soap on your nipples.  Wear a supportive bra designed especially for nursing. Avoid wearing underwire-style bras or extremely tight bras (sports bras).  Air-dry your nipples for 3-4 minutes after each feeding.  Use only  cotton bra pads to absorb leaked breast milk. Leaking of breast milk between feedings is normal.  Use lanolin on your nipples after breastfeeding. Lanolin helps to maintain your skin's normal moisture barrier. Pure lanolin is not harmful (not toxic) to your baby. You may also hand express a few drops of breast milk and gently massage that milk into your nipples and allow the milk to air-dry. In the first few weeks after giving birth, some women experience breast engorgement. Engorgement can make your breasts feel heavy, warm, and tender to the touch. Engorgement   peaks within 3-5 days after you give birth. The following recommendations can help to ease engorgement:  Completely empty your breasts while breastfeeding or pumping. You may want to start by applying warm, moist heat (in the shower or with warm, water-soaked hand towels) just before feeding or pumping. This increases circulation and helps the milk flow. If your baby does not completely empty your breasts while breastfeeding, pump any extra milk after he or she is finished.  Apply ice packs to your breasts immediately after breastfeeding or pumping, unless this is too uncomfortable for you. To do this: ? Put ice in a plastic bag. ? Place a towel between your skin and the bag. ? Leave the ice on for 20 minutes, 2-3 times a day.  Make sure that your baby is latched on and positioned properly while breastfeeding. If engorgement persists after 48 hours of following these recommendations, contact your health care provider or a lactation consultant. Overall health care recommendations while breastfeeding  Eat 3 healthy meals and 3 snacks every day. Well-nourished mothers who are breastfeeding need an additional 450-500 calories a day. You can meet this requirement by increasing the amount of a balanced diet that you eat.  Drink enough water to keep your urine pale yellow or clear.  Rest often, relax, and continue to take your prenatal vitamins  to prevent fatigue, stress, and low vitamin and mineral levels in your body (nutrient deficiencies).  Do not use any products that contain nicotine or tobacco, such as cigarettes and e-cigarettes. Your baby may be harmed by chemicals from cigarettes that pass into breast milk and exposure to secondhand smoke. If you need help quitting, ask your health care provider.  Avoid alcohol.  Do not use illegal drugs or marijuana.  Talk with your health care provider before taking any medicines. These include over-the-counter and prescription medicines as well as vitamins and herbal supplements. Some medicines that may be harmful to your baby can pass through breast milk.  It is possible to become pregnant while breastfeeding. If birth control is desired, ask your health care provider about options that will be safe while breastfeeding your baby. Where to find more information: [employer]: www.llli.org Contact a health care provider if:  You feel like you want to stop breastfeeding or have become frustrated with breastfeeding.  Your nipples are cracked or bleeding.  Your breasts are red, tender, or warm.  You have: ? Painful breasts or nipples. ? A swollen area on either breast. ? A fever or chills. ? Nausea or vomiting. ? Drainage other than breast milk from your nipples.  Your breasts do not become full before feedings by the fifth day after you give birth.  You feel sad and depressed.  Your baby is: ? Too sleepy to eat well. ? Having trouble sleeping. ? More than 1 week old and wetting fewer than 6 diapers in a 24-hour period. ? Not gaining weight by 5 days of age.  Your baby has fewer than 3 stools in a 24-hour period.  Your baby's skin or the white parts of his or her eyes become yellow. Get help right away if:  Your baby is overly tired (lethargic) and does not want to wake up and feed.  Your baby develops an unexplained fever. Summary  Breastfeeding  offers many health benefits for infant and mothers.  Try to breastfeed your infant when he or she shows early signs of hunger.  Gently tickle or stroke your baby's lips with   your finger or nipple to allow the baby to open his or her mouth. Bring the baby to your breast. Make sure that much of the areola is in your baby's mouth. Offer one side and burp the baby before you offer the other side.  Talk with your health care provider or lactation consultant if you have questions or you face problems as you breastfeed. This information is not intended to replace advice given to you by your health care provider. Make sure you discuss any questions you have with your health care provider. Document Revised: 05/18/2017 Document Reviewed: 03/25/2016 Elsevier Patient Education  2020 Elsevier Inc.  

## 2020-02-21 ENCOUNTER — Telehealth (HOSPITAL_COMMUNITY): Payer: Self-pay | Admitting: *Deleted

## 2020-02-21 ENCOUNTER — Encounter (HOSPITAL_COMMUNITY): Payer: Self-pay | Admitting: *Deleted

## 2020-02-21 ENCOUNTER — Encounter: Payer: Self-pay | Admitting: *Deleted

## 2020-02-21 NOTE — Telephone Encounter (Signed)
Preadmission screen  

## 2020-02-23 ENCOUNTER — Other Ambulatory Visit: Payer: Self-pay | Admitting: Advanced Practice Midwife

## 2020-02-24 ENCOUNTER — Other Ambulatory Visit: Payer: Self-pay | Admitting: Advanced Practice Midwife

## 2020-02-27 ENCOUNTER — Ambulatory Visit (INDEPENDENT_AMBULATORY_CARE_PROVIDER_SITE_OTHER): Payer: Medicaid Other | Admitting: Obstetrics & Gynecology

## 2020-02-27 ENCOUNTER — Other Ambulatory Visit: Payer: Self-pay

## 2020-02-27 VITALS — BP 121/86 | HR 93 | Wt 181.8 lb

## 2020-02-27 DIAGNOSIS — Z3A39 39 weeks gestation of pregnancy: Secondary | ICD-10-CM | POA: Diagnosis not present

## 2020-02-27 DIAGNOSIS — O0993 Supervision of high risk pregnancy, unspecified, third trimester: Secondary | ICD-10-CM | POA: Diagnosis not present

## 2020-02-27 DIAGNOSIS — O0990 Supervision of high risk pregnancy, unspecified, unspecified trimester: Secondary | ICD-10-CM

## 2020-02-27 DIAGNOSIS — O99820 Streptococcus B carrier state complicating pregnancy: Secondary | ICD-10-CM

## 2020-02-27 DIAGNOSIS — O34219 Maternal care for unspecified type scar from previous cesarean delivery: Secondary | ICD-10-CM

## 2020-02-27 NOTE — Progress Notes (Signed)
   PRENATAL VISIT NOTE  Subjective:  Candice Downs is a 39 y.o. G4P1021 at [redacted]w[redacted]d being seen today for ongoing prenatal care.  She is currently monitored for the following issues for this high-risk pregnancy and has Supervision of high risk pregnancy, antepartum; Short interval between pregnancies affecting pregnancy, antepartum; Anxiety; OCD (obsessive compulsive disorder); Previous cesarean delivery affecting pregnancy; Multigravida of advanced maternal age in third trimester; and GBS (group B Streptococcus carrier), +RV culture, currently pregnant on their problem list.  Patient reports no complaints.  Contractions: Irritability. Vag. Bleeding: None.  Movement: Present. Denies leaking of fluid.   The following portions of the patient's history were reviewed and updated as appropriate: allergies, current medications, past family history, past medical history, past social history, past surgical history and problem list.   Objective:   Vitals:   02/27/20 1518  BP: 121/86  Pulse: 93  Weight: 181 lb 12.8 oz (82.5 kg)    Fetal Status: Fetal Heart Rate (bpm): 138   Movement: Present     General:  Alert, oriented and cooperative. Patient is in no acute distress.  Skin: Skin is warm and dry. No rash noted.   Cardiovascular: Normal heart rate noted  Respiratory: Normal respiratory effort, no problems with respiration noted  Abdomen: Soft, gravid, appropriate for gestational age.  Pain/Pressure: Present     Pelvic: Cervical exam performed in the presence of a chaperone.  Posterior and could not reach on exam.        Extremities: Normal range of motion.  Edema: None  Mental Status: Normal mood and affect. Normal behavior. Normal judgment and thought content.   Assessment and Plan:  Pregnancy: G4P1021 at [redacted]w[redacted]d 1. [redacted] weeks gestation of pregnancy - Induction scheduled for 1226/2021  2. GBS (group B Streptococcus carrier), +RV culture, currently pregnant - Pt aware she will be treated in  labor  3. Supervision of high risk pregnancy, antepartum - Cont baby ASA and PNV  4. Previous cesarean delivery affecting pregnancy -TOL is planned  Term labor symptoms and general obstetric precautions including but not limited to vaginal bleeding, contractions, leaking of fluid and fetal movement were reviewed in detail with the patient. Please refer to After Visit Summary for other counseling recommendations.   No follow-ups on file.  Future Appointments  Date Time Provider Department Center  03/01/2020 12:00 AM [facility] SCHED ROOM [facility] None    Jerene Bears, MD

## 2020-02-29 ENCOUNTER — Other Ambulatory Visit: Payer: Self-pay

## 2020-03-01 ENCOUNTER — Inpatient Hospital Stay (HOSPITAL_COMMUNITY): Payer: Medicaid Other

## 2020-03-01 ENCOUNTER — Other Ambulatory Visit: Payer: Self-pay

## 2020-03-01 ENCOUNTER — Inpatient Hospital Stay (HOSPITAL_COMMUNITY): Payer: Medicaid Other | Admitting: Anesthesiology

## 2020-03-01 ENCOUNTER — Inpatient Hospital Stay (HOSPITAL_COMMUNITY)
Admission: AD | Admit: 2020-03-01 | Discharge: 2020-03-03 | DRG: 806 | Disposition: A | Discharge status: Home / Self Care | Payer: Medicaid Other | Attending: Obstetrics and Gynecology | Admitting: Obstetrics and Gynecology

## 2020-03-01 ENCOUNTER — Encounter (HOSPITAL_COMMUNITY): Payer: Self-pay | Admitting: Family Medicine

## 2020-03-01 DIAGNOSIS — O99824 Streptococcus B carrier state complicating childbirth: Secondary | ICD-10-CM | POA: Diagnosis not present

## 2020-03-01 DIAGNOSIS — O26893 Other specified pregnancy related conditions, third trimester: Secondary | ICD-10-CM | POA: Diagnosis not present

## 2020-03-01 DIAGNOSIS — O9081 Anemia of the puerperium: Secondary | ICD-10-CM | POA: Diagnosis not present

## 2020-03-01 DIAGNOSIS — Z20822 Contact with and (suspected) exposure to covid-19: Secondary | ICD-10-CM | POA: Diagnosis not present

## 2020-03-01 DIAGNOSIS — Z3A40 40 weeks gestation of pregnancy: Secondary | ICD-10-CM

## 2020-03-01 DIAGNOSIS — O480 Post-term pregnancy: Secondary | ICD-10-CM | POA: Diagnosis not present

## 2020-03-01 DIAGNOSIS — Z30430 Encounter for insertion of intrauterine contraceptive device: Secondary | ICD-10-CM

## 2020-03-01 DIAGNOSIS — F419 Anxiety disorder, unspecified: Secondary | ICD-10-CM | POA: Diagnosis present

## 2020-03-01 DIAGNOSIS — O34219 Maternal care for unspecified type scar from previous cesarean delivery: Secondary | ICD-10-CM | POA: Diagnosis not present

## 2020-03-01 DIAGNOSIS — D62 Acute posthemorrhagic anemia: Secondary | ICD-10-CM | POA: Diagnosis not present

## 2020-03-01 DIAGNOSIS — Z975 Presence of (intrauterine) contraceptive device: Secondary | ICD-10-CM

## 2020-03-01 DIAGNOSIS — O99344 Other mental disorders complicating childbirth: Secondary | ICD-10-CM | POA: Diagnosis not present

## 2020-03-01 DIAGNOSIS — F429 Obsessive-compulsive disorder, unspecified: Secondary | ICD-10-CM | POA: Diagnosis not present

## 2020-03-01 DIAGNOSIS — Z98891 History of uterine scar from previous surgery: Secondary | ICD-10-CM | POA: Diagnosis not present

## 2020-03-01 DIAGNOSIS — Z880 Allergy status to penicillin: Secondary | ICD-10-CM

## 2020-03-01 LAB — TYPE AND SCREEN
ABO/RH(D): O POS
Antibody Screen: NEGATIVE

## 2020-03-01 LAB — RESP PANEL BY RT-PCR (FLU A&B, COVID) ARPGX2
Influenza A by PCR: NEGATIVE
Influenza B by PCR: NEGATIVE
SARS Coronavirus 2 by RT PCR: NEGATIVE

## 2020-03-01 LAB — CBC
HCT: 40.3 % (ref 36.0–46.0)
Hemoglobin: 13.7 g/dL (ref 12.0–15.0)
MCH: 28.8 pg (ref 26.0–34.0)
MCHC: 34 g/dL (ref 30.0–36.0)
MCV: 84.8 fL (ref 80.0–100.0)
Platelets: 180 10*3/uL (ref 150–400)
RBC: 4.75 MIL/uL (ref 3.87–5.11)
RDW: 14.5 % (ref 11.5–15.5)
WBC: 11.4 10*3/uL — ABNORMAL HIGH (ref 4.0–10.5)
nRBC: 0 % (ref 0.0–0.2)

## 2020-03-01 LAB — RPR: RPR Ser Ql: NONREACTIVE

## 2020-03-01 MED ORDER — DIPHENHYDRAMINE HCL 50 MG/ML IJ SOLN
12.5000 mg | INTRAMUSCULAR | Status: DC | PRN
Start: 2020-03-01 — End: 2020-03-01

## 2020-03-01 MED ORDER — OXYTOCIN-SODIUM CHLORIDE 30-0.9 UT/500ML-% IV SOLN
2.5000 [IU]/h | INTRAVENOUS | Status: DC
  Administered 2020-03-02: 09:00:00 2.5 [IU]/h via INTRAVENOUS
  Filled 2020-03-01: qty 500

## 2020-03-01 MED ORDER — TERBUTALINE SULFATE 1 MG/ML IJ SOLN: 0.2500 mg | Freq: Once | INTRAMUSCULAR | Status: DC | PRN

## 2020-03-01 MED ORDER — LACTATED RINGERS IV SOLN: INTRAVENOUS | Status: DC

## 2020-03-01 MED ORDER — FLUOXETINE HCL 20 MG PO CAPS
40.0000 mg | ORAL_CAPSULE | Freq: Every day | ORAL | Status: DC
  Administered 2020-03-01 – 2020-03-03 (×3): 40 mg via ORAL
  Filled 2020-03-01 (×4): qty 2

## 2020-03-01 MED ORDER — OXYTOCIN-SODIUM CHLORIDE 30-0.9 UT/500ML-% IV SOLN
1.0000 m[IU]/min | INTRAVENOUS | Status: DC
  Administered 2020-03-01: 02:00:00 2 m[IU]/min via INTRAVENOUS
  Filled 2020-03-01: qty 500

## 2020-03-01 MED ORDER — DIPHENHYDRAMINE HCL 50 MG/ML IJ SOLN
12.5000 mg | INTRAMUSCULAR | Status: DC | PRN
  Administered 2020-03-01: 23:00:00 12.5 mg via INTRAVENOUS
  Filled 2020-03-01: qty 1

## 2020-03-01 MED ORDER — ACETAMINOPHEN 325 MG PO TABS
650.0000 mg | ORAL_TABLET | ORAL | Status: DC | PRN
  Administered 2020-03-01 – 2020-03-02 (×2): 650 mg via ORAL
  Filled 2020-03-01 (×2): qty 2

## 2020-03-01 MED ORDER — FENTANYL CITRATE (PF) 100 MCG/2ML IJ SOLN: 100.0000 ug | INTRAMUSCULAR | Status: DC | PRN

## 2020-03-01 MED ORDER — VANCOMYCIN HCL IN DEXTROSE 1-5 GM/200ML-% IV SOLN
1000.0000 mg | Freq: Two times a day (BID) | INTRAVENOUS | Status: DC
  Filled 2020-03-01: qty 200

## 2020-03-01 MED ORDER — LACTATED RINGERS IV SOLN
500.0000 mL | Freq: Once | INTRAVENOUS | Status: AC
  Administered 2020-03-01: 14:00:00 500 mL via INTRAVENOUS

## 2020-03-01 MED ORDER — CEFAZOLIN SODIUM-DEXTROSE 1-4 GM/50ML-% IV SOLN
1.0000 g | Freq: Three times a day (TID) | INTRAVENOUS | Status: DC
  Administered 2020-03-02: 05:00:00 1 g via INTRAVENOUS
  Filled 2020-03-01 (×2): qty 50

## 2020-03-01 MED ORDER — SODIUM CHLORIDE 0.9 % IV SOLN: 2.0000 g | Freq: Four times a day (QID) | INTRAVENOUS | Status: DC

## 2020-03-01 MED ORDER — EPHEDRINE 5 MG/ML INJ: 10.0000 mg | INTRAVENOUS | Status: DC | PRN

## 2020-03-01 MED ORDER — PENICILLIN G POT IN DEXTROSE 60000 UNIT/ML IV SOLN
3.0000 10*6.[IU] | INTRAVENOUS | Status: DC
  Administered 2020-03-01 (×3): 3 10*6.[IU] via INTRAVENOUS
  Filled 2020-03-01 (×3): qty 50

## 2020-03-01 MED ORDER — FENTANYL-BUPIVACAINE-NACL 0.5-0.125-0.9 MG/250ML-% EP SOLN: 12.0000 mL/h | EPIDURAL | Status: DC | PRN

## 2020-03-01 MED ORDER — PHENYLEPHRINE 40 MCG/ML (10ML) SYRINGE FOR IV PUSH (FOR BLOOD PRESSURE SUPPORT): 80.0000 ug | PREFILLED_SYRINGE | INTRAVENOUS | Status: DC | PRN

## 2020-03-01 MED ORDER — ARIPIPRAZOLE 2 MG PO TABS
2.0000 mg | ORAL_TABLET | Freq: Every day | ORAL | Status: DC
  Administered 2020-03-01 – 2020-03-03 (×3): 2 mg via ORAL
  Filled 2020-03-01 (×4): qty 1

## 2020-03-01 MED ORDER — CEFAZOLIN SODIUM-DEXTROSE 2-4 GM/100ML-% IV SOLN
2.0000 g | Freq: Once | INTRAVENOUS | Status: AC
  Administered 2020-03-01: 21:00:00 2 g via INTRAVENOUS
  Filled 2020-03-01: qty 100

## 2020-03-01 MED ORDER — OXYTOCIN BOLUS FROM INFUSION
333.0000 mL | Freq: Once | INTRAVENOUS | Status: AC
  Administered 2020-03-02: 08:00:00 333 mL via INTRAVENOUS

## 2020-03-01 MED ORDER — FENTANYL-BUPIVACAINE-NACL 0.5-0.125-0.9 MG/250ML-% EP SOLN
12.0000 mL/h | EPIDURAL | Status: DC | PRN
  Filled 2020-03-01: qty 250

## 2020-03-01 MED ORDER — LACTATED RINGERS IV SOLN: 500.0000 mL | INTRAVENOUS | Status: DC | PRN

## 2020-03-01 MED ORDER — SODIUM CHLORIDE 0.9 % IV SOLN
5.0000 10*6.[IU] | Freq: Once | INTRAVENOUS | Status: AC
  Administered 2020-03-01: 02:00:00 5 10*6.[IU] via INTRAVENOUS
  Filled 2020-03-01: qty 5

## 2020-03-01 MED ORDER — LACTATED RINGERS IV SOLN
500.0000 mL | Freq: Once | INTRAVENOUS | Status: AC
  Administered 2020-03-01: 16:00:00 500 mL via INTRAVENOUS

## 2020-03-01 MED ORDER — FENTANYL CITRATE (PF) 100 MCG/2ML IJ SOLN: 50.0000 ug | INTRAMUSCULAR | Status: DC | PRN

## 2020-03-01 MED ORDER — LIDOCAINE HCL (PF) 1 % IJ SOLN
INTRAMUSCULAR | Status: DC | PRN
  Administered 2020-03-01 (×2): 4 mL via EPIDURAL

## 2020-03-01 MED ORDER — SOD CITRATE-CITRIC ACID 500-334 MG/5ML PO SOLN
30.0000 mL | ORAL | Status: DC | PRN
  Administered 2020-03-01 (×2): 30 mL via ORAL
  Filled 2020-03-01 (×2): qty 15

## 2020-03-01 MED ORDER — SODIUM CHLORIDE (PF) 0.9 % IJ SOLN
INTRAMUSCULAR | Status: DC | PRN
  Administered 2020-03-01: 12 mL/h via EPIDURAL

## 2020-03-01 MED ORDER — ONDANSETRON HCL 4 MG/2ML IJ SOLN
4.0000 mg | Freq: Four times a day (QID) | INTRAMUSCULAR | Status: DC | PRN
  Administered 2020-03-01 – 2020-03-02 (×2): 4 mg via INTRAVENOUS
  Filled 2020-03-01 (×2): qty 2

## 2020-03-01 MED ORDER — LIDOCAINE HCL (PF) 1 % IJ SOLN: 30.0000 mL | INTRAMUSCULAR | Status: DC | PRN

## 2020-03-01 MED ORDER — PHENYLEPHRINE 40 MCG/ML (10ML) SYRINGE FOR IV PUSH (FOR BLOOD PRESSURE SUPPORT)
80.0000 ug | PREFILLED_SYRINGE | INTRAVENOUS | Status: DC | PRN
  Administered 2020-03-01: 22:00:00 80 ug via INTRAVENOUS

## 2020-03-01 MED ORDER — LEVONORGESTREL 19.5 MCG/DAY IU IUD
INTRAUTERINE_SYSTEM | Freq: Once | INTRAUTERINE | Status: AC
  Administered 2020-03-02: 08:00:00 1 via INTRAUTERINE
  Filled 2020-03-01: qty 1

## 2020-03-01 MED ORDER — PHENYLEPHRINE 40 MCG/ML (10ML) SYRINGE FOR IV PUSH (FOR BLOOD PRESSURE SUPPORT)
80.0000 ug | PREFILLED_SYRINGE | INTRAVENOUS | Status: DC | PRN
  Filled 2020-03-01: qty 10

## 2020-03-01 NOTE — Progress Notes (Signed)
Labor Progress Note Candice Downs is a 39 y.o. G4P1021 at [redacted]w[redacted]d presented for eIOL in setting of TOLAC.  S: Pt reporting increasing discomfort with contractions. Pt desires epidural prior to AROM.  O:  BP 124/81   Pulse 79   Temp 98 F (36.7 C) (Oral)   Resp 18   Ht 5\' 4"  (1.626 m)   Wt 83.4 kg   LMP 05/24/2019   SpO2 99%   BMI 31.57 kg/m  EFM: baseline 135 bpm/mod variability/+accels/intermittent variable decels s/p AROM, now resolved s/p maternal repositioning Toco: q3-5 min  CVE: Dilation: 6 Effacement (%): 60 Cervical Position: Posterior Station: -1 Presentation: Vertex Exam by:: Rainn Zupko, MD   A&P: 39 y.o. 30 [redacted]w[redacted]d presented for eIOL in setting of TOLAC. #eIOL  TOLAC: Pt progressing well s/p FB (expulsion at 1030). Good fetal tolerance with up-titration of pitocin. AROM for scant clear fluid at 1515. IUPC placed to assist with up-titration of pitocin in setting of TOLAC.  #Pain: plan for epidural #FWB: Category 1 strip s/p IVF bolus and maternal repositioning. #GBS positive ; PCN started on admission. Now s/p adequate antibiotics. #Anxiety/OCD: continue home abilify and prozac.  [redacted]w[redacted]d, MD OB Fellow, [facility] 03/01/2020 3:48 PM

## 2020-03-01 NOTE — Anesthesia Procedure Notes (Signed)
Epidural Patient location during procedure: OB Start time: 03/01/2020 2:22 PM End time: 03/01/2020 2:26 PM  Staffing Anesthesiologist: Kaylyn Layer, MD Performed: anesthesiologist   Preanesthetic Checklist Completed: patient identified, IV checked, risks and benefits discussed, monitors and equipment checked, pre-op evaluation and timeout performed  Epidural Patient position: sitting Prep: DuraPrep and site prepped and draped Patient monitoring: continuous pulse ox, blood pressure and heart rate Approach: midline Location: L3-L4 Injection technique: LOR air  Needle:  Needle type: Tuohy  Needle gauge: 17 G Needle length: 9 cm Needle insertion depth: 6 cm Catheter type: closed end flexible Catheter size: 19 Gauge Catheter at skin depth: 11 cm Test dose: negative and Other (1% lidocaine)  Assessment Events: blood not aspirated, injection not painful, no injection resistance, no paresthesia and negative IV test  Additional Notes Patient identified. Risks, benefits, and alternatives discussed with patient including but not limited to bleeding, infection, nerve damage, paralysis, failed block, incomplete pain control, headache, blood pressure changes, nausea, vomiting, reactions to medication, itching, and postpartum back pain. Confirmed with bedside nurse the patient's most recent platelet count. Confirmed with patient that they are not currently taking any anticoagulation, have any bleeding history, or any family history of bleeding disorders. Patient expressed understanding and wished to proceed. All questions were answered. Sterile technique was used throughout the entire procedure. Please see nursing notes for vital signs.   Crisp LOR after one needle redirection. Test dose was given through epidural catheter and negative prior to continuing to dose epidural or start infusion. Warning signs of high block given to the patient including shortness of breath, tingling/numbness in  hands, complete motor block, or any concerning symptoms with instructions to call for help. Patient was given instructions on fall risk and not to get out of bed. All questions and concerns addressed with instructions to call with any issues or inadequate analgesia.  Reason for block:procedure for pain

## 2020-03-01 NOTE — Progress Notes (Signed)
Labor Progress Note Candice Downs is a 39 y.o. G4P1021 at [redacted]w[redacted]d presented for eIOL in setting of TOLAC.  S: Doing well without complaints.  O:  BP (!) 93/55   Pulse 93   Temp 98 F (36.7 C) (Oral)   Resp 16   Ht 5\' 4"  (1.626 m)   Wt 83.4 kg   LMP 05/24/2019   SpO2 99%   BMI 31.57 kg/m  EFM: baseline 135 bpm/mod variability/+accels/no decels Toco: q2-5 min  CVE: Dilation: 7 Effacement (%): 90 Cervical Position: Posterior Station: 0 Presentation: Vertex Exam by:: Dr. [email or web address]   A&P: 39 y.o. 30 [redacted]w[redacted]d presented for eIOL in setting of TOLAC. #eIOL  TOLAC: Pt progressing well s/p FB (expulsion at 1030). Good fetal tolerance with up-titration of pitocin. AROM for scant clear fluid at 1515. IUPC placed to assist with up-titration of pitocin in setting of TOLAC. Continue to titrate.  #Pain: epidural #FWB: Category 1 #GBS positive ; PCN started on admission. Now s/p adequate antibiotics. Switched to cefazolin q8 given some burning with infusion. #Anxiety/OCD: continue home abilify and prozac.  [redacted]w[redacted]d, MD OB Fellow, [facility] 03/01/2020 8:35 PM

## 2020-03-01 NOTE — H&P (Signed)
OBSTETRIC ADMISSION HISTORY AND PHYSICAL  Candice Downs is a 39 y.o. female [phone] with IUP at [redacted]w[redacted]d by LMP presenting for eIOL. She reports +FMs, No LOF, no VB, no blurry vision, headaches or peripheral edema, and RUQ pain.  She plans on breast feeding. She request post placental liletta for birth control. She received her prenatal care at [facility]  Dating: By LMP --->  Estimated Date of Delivery: 02/28/20  Sono:    02/07/20@[redacted]w[redacted]d , CWD, normal anatomy, cephalic presentation, 3360g, 78% EFW   Prenatal History/Complications:  GBS pos Anxiety/OCD (on meds) History of cesarean section (breech)   Past Medical History: Past Medical History:  Diagnosis Date  . Anxiety   . Depression     Past Surgical History: Past Surgical History:  Procedure Laterality Date  . CESAREAN SECTION    . HERNIA REPAIR      Obstetrical History: OB History    Gravida  4   Para  1   Term  1   Preterm  0   AB  2   Living  1     SAB  2   IAB  0   Ectopic  0   Multiple  0   Live Births  1           Social History Social History   Socioeconomic History  . Marital status: Married    Spouse name: Not on file  . Number of children: Not on file  . Years of education: Not on file  . Highest education level: Not on file  Occupational History  . Not on file  Tobacco Use  . Smoking status: Never Smoker  . Smokeless tobacco: Never Used  Vaping Use  . Vaping Use: Never used  Substance and Sexual Activity  . Alcohol use: Not Currently  . Drug use: Never  . Sexual activity: Yes    Birth control/protection: None  Other Topics Concern  . Not on file  Social History Narrative  . Not on file   Social Determinants of Health   Financial Resource Strain: Not on file  Food Insecurity: No Food Insecurity  . Worried About Programme researcher, broadcasting/film/video in the Last Year: Never true  . Ran Out of Food in the Last Year: Never true  Transportation Needs: No Transportation Needs  . Lack of  Transportation (Medical): No  . Lack of Transportation (Non-Medical): No  Physical Activity: Not on file  Stress: Not on file  Social Connections: Not on file    Family History: History reviewed. No pertinent family history.  Allergies: Allergies  Allergen Reactions  . Amoxicillin Rash    Rash as child, no anaphylaxis  . Drug Ingredient [Clavulanic Acid] Hives and Rash    Medications Prior to Admission  Medication Sig Dispense Refill Last Dose  . ARIPiprazole (ABILIFY) 2 MG tablet Take 2 mg by mouth daily.   02/29/2020 at Unknown time  . aspirin 81 MG EC tablet Take 81 mg by mouth daily. Swallow whole.   02/29/2020 at Unknown time  . FLUoxetine (PROZAC) 40 MG capsule Take 40 mg by mouth daily.   02/29/2020 at Unknown time  . Prenatal Vit-Fe Fumarate-FA (MULTIVITAMIN-PRENATAL) 27-0.8 MG TABS tablet Take 1 tablet by mouth daily at 12 noon.   02/29/2020 at Unknown time     Review of Systems   All systems reviewed and negative except as stated in HPI  Blood pressure 113/70, pulse (!) 117, temperature 98.2 F (36.8 C), temperature source Oral, resp. rate 18,  height 5\' 4"  (1.626 m), weight 83.4 kg, last menstrual period 05/24/2019, SpO2 99 %. General appearance: alert, cooperative and no distress Lungs: normal respiratory effort Heart: regular rate and rhythm Abdomen: soft, non-tender; gravid Pelvic: as noted below Extremities: Homans sign is negative, no sign of DVT Presentation: cephalic by BSUS Fetal monitoringBaseline: 120 bpm, Variability: Good {> 6 bpm), Accelerations: Reactive and Decelerations: Absent Uterine activity quiet     Prenatal labs: ABO, Rh: O/Positive/-- (04/27 0000) Antibody: Negative (04/27 0000) Rubella: Immune (04/27 0000) RPR: Non Reactive (11/17 1047)  HBsAg: Negative (04/27 0000)  HIV: Non Reactive (11/17 1047)  GBS: Positive/-- (12/02 1614)  1 hr Glucola passed, 3hr passed Genetic screening  Reportedly normal Anatomy 07-25-1990 normal  Prenatal  Transfer Tool  Maternal Diabetes: No Genetic Screening: Normal Maternal Ultrasounds/Referrals: Normal Fetal Ultrasounds or other Referrals:  None Maternal Substance Abuse:  No Significant Maternal Medications:  Meds include: Prozac Other: abilify Significant Maternal Lab Results: Group B Strep positive  Results for orders placed or performed during the hospital encounter of 03/01/20 (from the past 24 hour(s))  CBC   Collection Time: 03/01/20 12:30 AM  Result Value Ref Range   WBC 11.4 (H) 4.0 - 10.5 K/uL   RBC 4.75 3.87 - 5.11 MIL/uL   Hemoglobin 13.7 12.0 - 15.0 g/dL   HCT 03/03/20 46.5 - 68.1 %   MCV 84.8 80.0 - 100.0 fL   MCH 28.8 26.0 - 34.0 pg   MCHC 34.0 30.0 - 36.0 g/dL   RDW 27.5 17.0 - 01.7 %   Platelets 180 150 - 400 K/uL   nRBC 0.0 0.0 - 0.2 %    Patient Active Problem List   Diagnosis Date Noted  . Post-dates pregnancy 03/01/2020  . GBS (group B Streptococcus carrier), +RV culture, currently pregnant 02/11/2020  . Supervision of high risk pregnancy, antepartum 01/22/2020  . Short interval between pregnancies affecting pregnancy, antepartum 01/22/2020  . Anxiety 01/22/2020  . OCD (obsessive compulsive disorder) 01/22/2020  . Previous cesarean delivery affecting pregnancy 01/22/2020  . Multigravida of advanced maternal age in third trimester 01/22/2020    Assessment/Plan:  Candice Downs is a 39 y.o. G4P1021 at [redacted]w[redacted]d here for eIOL.  #IOL/TOLAC: Consents previously signed 01/22/20. Given cervical exam will start with low dose pitocin and titrate 2x2 to 6cc/hr. Will place FB when able. #Pain: PRN, desires epidural #FWB: Cat 1 #ID: GBS pos, discussed prior PCN allergy with patient, rash when she was a child not hives, discussed risks/benefits of PCN vs vanc, patient elects for PCN at this time #MOF: breast #MOC: post placental liletta, risks/benefits discussed #Circ:  Yes #Anxiety/OCD: stable on current regimen abilify/prozac, will continue.  01/24/20, MD   03/01/2020, 1:10 AM

## 2020-03-01 NOTE — Progress Notes (Signed)
Labor Progress Note Candice Downs is a 39 y.o. G4P1021 at [redacted]w[redacted]d presented for eIOL/TOLAC. S: Doing well without complaints.  O:  BP (!) 130/91   Pulse (!) 103   Temp 98.2 F (36.8 C) (Oral)   Resp 18   Ht 5\' 4"  (1.626 m)   Wt 83.4 kg   LMP 05/24/2019   SpO2 99%   BMI 31.57 kg/m  EFM: baseline 120bpm/mod variability/+accels/no decels Toco: q1-5 min  CVE: Dilation: 1 Effacement (%): 50 Station: -2 Presentation: Vertex Exam by:: Dr. [email or web address]   A&P: 39 y.o. 30 [redacted]w[redacted]d presented for eIOL/TOLAC. #IOL/TOLAC: Risks/benefits of FB discussed and placed without difficulty. Patient has made good cervical change since last exam. Continue low dose pitocin at 6cc/hr until FB dislodged. #Pain: PRN #FWB: cat 1 #GBS positive, PCN, doing well without reaction #Anxiety/OCD: continue home abilify and prozac.  [redacted]w[redacted]d, MD 6:21 AM

## 2020-03-01 NOTE — Anesthesia Preprocedure Evaluation (Addendum)
Anesthesia Evaluation  Patient identified by MRN, date of birth, ID band Patient awake    Reviewed: Allergy & Precautions, Patient's Chart, lab work & pertinent test results  History of Anesthesia Complications Negative for: history of anesthetic complications  Airway Mallampati: II  TM Distance: >3 FB Neck ROM: Full    Dental no notable dental hx.    Pulmonary neg pulmonary ROS,    Pulmonary exam normal        Cardiovascular negative cardio ROS Normal cardiovascular exam     Neuro/Psych negative neurological ROS  negative psych ROS   GI/Hepatic negative GI ROS, Neg liver ROS,   Endo/Other  negative endocrine ROS  Renal/GU negative Renal ROS  negative genitourinary   Musculoskeletal negative musculoskeletal ROS (+)   Abdominal   Peds  Hematology negative hematology ROS (+)   Anesthesia Other Findings Day of surgery medications reviewed with patient.  Reproductive/Obstetrics (+) Pregnancy (Hx of C/S x1)                             Anesthesia Physical Anesthesia Plan  ASA: III  Anesthesia Plan: Epidural   Post-op Pain Management:    Induction:   PONV Risk Score and Plan: Treatment may vary due to age or medical condition  Airway Management Planned: Natural Airway  Additional Equipment:   Intra-op Plan:   Post-operative Plan:   Informed Consent: I have reviewed the patients History and Physical, chart, labs and discussed the procedure including the risks, benefits and alternatives for the proposed anesthesia with the patient or authorized representative who has indicated his/her understanding and acceptance.       Plan Discussed with:   Anesthesia Plan Comments:        Anesthesia Quick Evaluation

## 2020-03-01 NOTE — Progress Notes (Signed)
Labor Progress Note Candice Downs is a 39 y.o. G4P1021 at [redacted]w[redacted]d presented for eIOL in setting of TOLAC.  S: Pt reporting increasing discomfort with contractions. Pt desires epidural prior to AROM.  O:  BP 126/90   Pulse 95   Temp 98 F (36.7 C) (Oral)   Resp 18   Ht 5\' 4"  (1.626 m)   Wt 83.4 kg   LMP 05/24/2019   SpO2 99%   BMI 31.57 kg/m  EFM: baseline 150bpm/mod variability/+accels/no decels Toco: q4 min  CVE: Dilation: 5 Effacement (%): 60 Cervical Position: Posterior Station: -2 Presentation: Vertex Exam by:: Dawsen Krieger, MD   A&P: 39 y.o. 30 [redacted]w[redacted]d presented for eIOL in setting of TOLAC. #eIOL  TOLAC: Pt progressing well s/p FB (expulsion at 1030). Good fetal tolerance with up-titration of pitocin. Will plan for AROM s/p epidural.  #Pain: plan for epidural #FWB: Category 1 strip #GBS positive ; PCN started on admission. Now s/p adequate antibiotics. #Anxiety/OCD: continue home abilify and prozac.  [redacted]w[redacted]d, MD OB Fellow, Faculty Practice 03/01/2020 1:52 PM

## 2020-03-02 ENCOUNTER — Encounter (HOSPITAL_COMMUNITY): Payer: Self-pay | Admitting: Family Medicine

## 2020-03-02 DIAGNOSIS — Z3A40 40 weeks gestation of pregnancy: Secondary | ICD-10-CM

## 2020-03-02 DIAGNOSIS — O480 Post-term pregnancy: Secondary | ICD-10-CM

## 2020-03-02 DIAGNOSIS — Z98891 History of uterine scar from previous surgery: Secondary | ICD-10-CM | POA: Diagnosis not present

## 2020-03-02 DIAGNOSIS — Z975 Presence of (intrauterine) contraceptive device: Secondary | ICD-10-CM

## 2020-03-02 DIAGNOSIS — Z30430 Encounter for insertion of intrauterine contraceptive device: Secondary | ICD-10-CM

## 2020-03-02 MED ORDER — PRENATAL MULTIVITAMIN CH
1.0000 | ORAL_TABLET | Freq: Every day | ORAL | Status: DC
  Administered 2020-03-02 – 2020-03-03 (×2): 1 via ORAL
  Filled 2020-03-02 (×2): qty 1

## 2020-03-02 MED ORDER — DIPHENHYDRAMINE HCL 25 MG PO CAPS: 25.0000 mg | ORAL_CAPSULE | Freq: Four times a day (QID) | ORAL | Status: DC | PRN

## 2020-03-02 MED ORDER — SENNOSIDES-DOCUSATE SODIUM 8.6-50 MG PO TABS
2.0000 | ORAL_TABLET | Freq: Every day | ORAL | Status: DC
  Administered 2020-03-03: 11:00:00 2 via ORAL
  Filled 2020-03-02: qty 2

## 2020-03-02 MED ORDER — IBUPROFEN 600 MG PO TABS
600.0000 mg | ORAL_TABLET | Freq: Four times a day (QID) | ORAL | Status: DC
  Administered 2020-03-02 – 2020-03-03 (×6): 600 mg via ORAL
  Filled 2020-03-02 (×6): qty 1

## 2020-03-02 MED ORDER — ONDANSETRON HCL 4 MG PO TABS: 4.0000 mg | ORAL_TABLET | ORAL | Status: DC | PRN

## 2020-03-02 MED ORDER — WITCH HAZEL-GLYCERIN EX PADS: 1.0000 "application " | MEDICATED_PAD | CUTANEOUS | Status: DC | PRN

## 2020-03-02 MED ORDER — ACETAMINOPHEN 325 MG PO TABS
650.0000 mg | ORAL_TABLET | Freq: Four times a day (QID) | ORAL | Status: DC
  Administered 2020-03-02 – 2020-03-03 (×5): 650 mg via ORAL
  Filled 2020-03-02 (×6): qty 2

## 2020-03-02 MED ORDER — TRANEXAMIC ACID-NACL 1000-0.7 MG/100ML-% IV SOLN
INTRAVENOUS | Status: AC
  Filled 2020-03-02: qty 100

## 2020-03-02 MED ORDER — COCONUT OIL OIL: 1.0000 "application " | TOPICAL_OIL | Status: DC | PRN

## 2020-03-02 MED ORDER — TRANEXAMIC ACID-NACL 1000-0.7 MG/100ML-% IV SOLN
1000.0000 mg | INTRAVENOUS | Status: AC
  Administered 2020-03-02: 08:00:00 1000 mg via INTRAVENOUS

## 2020-03-02 MED ORDER — DIBUCAINE (PERIANAL) 1 % EX OINT: 1.0000 "application " | TOPICAL_OINTMENT | CUTANEOUS | Status: DC | PRN

## 2020-03-02 MED ORDER — BENZOCAINE-MENTHOL 20-0.5 % EX AERO: 1.0000 "application " | INHALATION_SPRAY | CUTANEOUS | Status: DC | PRN

## 2020-03-02 MED ORDER — ONDANSETRON HCL 4 MG/2ML IJ SOLN: 4.0000 mg | INTRAMUSCULAR | Status: DC | PRN

## 2020-03-02 MED ORDER — SIMETHICONE 80 MG PO CHEW: 80.0000 mg | CHEWABLE_TABLET | ORAL | Status: DC | PRN

## 2020-03-02 MED ORDER — TETANUS-DIPHTH-ACELL PERTUSSIS 5-2.5-18.5 LF-MCG/0.5 IM SUSY: 0.5000 mL | PREFILLED_SYRINGE | Freq: Once | INTRAMUSCULAR | Status: DC

## 2020-03-02 NOTE — Anesthesia Postprocedure Evaluation (Signed)
Anesthesia Post Note  Patient: Candice Downs  Procedure(s) Performed: AN AD HOC LABOR EPIDURAL     Patient location during evaluation: Mother Baby Anesthesia Type: Epidural Level of consciousness: awake Pain management: satisfactory to patient Vital Signs Assessment: post-procedure vital signs reviewed and stable Respiratory status: spontaneous breathing Cardiovascular status: stable Anesthetic complications: no   No complications documented.  Last Vitals:  Vitals:   03/02/20 1028 03/02/20 1209  BP: 98/79 92/62  Pulse: (!) 124 (!) 123  Resp: 18 18  Temp: 36.8 C   SpO2:      Last Pain:  Vitals:   03/02/20 1209  TempSrc:   PainSc: 0-No pain   Pain Goal:                   [employer]

## 2020-03-02 NOTE — Discharge Instructions (Signed)

## 2020-03-02 NOTE — Progress Notes (Signed)
Labor Progress Note Candice Downs is a 39 y.o. G4P1021 at [redacted]w[redacted]d presented for eIOL in setting of TOLAC.  S: Doing well without complaints.  O:  BP 107/67    Pulse 86    Temp 98.1 F (36.7 C) (Oral)    Resp 16    Ht 5\' 4"  (1.626 m)    Wt 83.4 kg    LMP 05/24/2019    SpO2 99%    BMI 31.57 kg/m  EFM: baseline 150 bpm/mod variability/+accels/no decels Toco: q2-5 min  CVE: Dilation: 10 Dilation Complete Date: 03/02/20 Dilation Complete Time: 0250 Effacement (%): 90 Cervical Position: Posterior Station: 0 Presentation: Vertex Exam by:: Dr. [email or web address]   A&P: 39 y.o. 30 [redacted]w[redacted]d presented for eIOL in setting of TOLAC. #eIOL   TOLAC: Pt progressing well s/p FB. AROM/IUPC for scant clear fluid at 1515. Contractions adequate, will continue pitocin at 20cc/hr per Dr. [redacted]w[redacted]d. Patient is complete, given station will labor down x1 hour and then begin practice pushing.  #Pain: epidural #FWB: Category 1 #GBS positive ; PCN started on admission. Now s/p adequate antibiotics. Switched to cefazolin q8 given some burning with infusion. #Anxiety/OCD: continue home abilify and prozac.  [name], MD OB Fellow, Faculty Practice 03/02/2020 2:55 AM

## 2020-03-02 NOTE — Progress Notes (Signed)
[name] was referred for history of depression/anxiety. * Referral screened out by Clinical Social Worker because none of the following criteria appear to apply: ~ History of anxiety/depression during this pregnancy, or of post-partum depression following prior delivery. ~ Diagnosis of anxiety and/or depression within last 3 years OR * [name]'s symptoms currently being treated with medication and/or therapy. Per further chart review, it appears that [name] is on abilify/prozac for anxiety and depression.     Please contact the Clinical Social Worker if needs arise, by [facility] request, or if [name] scores greater than 9/yes to question 10 on Edinburgh Postpartum Depression Screen.   Candice Downs, MSW, LCSW [facility] [place] [phone]

## 2020-03-02 NOTE — Discharge Summary (Signed)
Postpartum Discharge Summary  Date of Service updated 03/03/20     Patient Name: Candice Downs DOB: 1981-02-02 MRN: [id number]  Date of admission: 03/01/2020 Delivery date:03/02/2020  Delivering provider: Arrie Senate  Date of discharge: 03/03/2020  Admitting diagnosis: Post-dates pregnancy [O48.0] Intrauterine pregnancy: [redacted]w[redacted]d     Secondary diagnosis:  Active Problems:   Post-dates pregnancy   IUD (intrauterine device) in place   VBAC, delivered   Acute blood loss anemia  Additional problems: none    Discharge diagnosis: Term Pregnancy Delivered and VBAC                                              Post partum procedures:post placental liletta placed Augmentation: AROM, Pitocin and IP Foley Complications: None  Hospital course: Induction of Labor With Vaginal Delivery   39 y.o. yo [id number] at [redacted]w[redacted]d was admitted to the hospital 03/01/2020 for induction of labor.  Indication for induction: Elective.  Patient had an uncomplicated labor course as follows: Membrane Rupture Time/Date: 3:09 PM ,03/01/2020   Delivery Method:Vaginal, Spontaneous  Episiotomy: None  Lacerations:  Sulcus  Details of delivery can be found in separate delivery note.  Patient had a routine postpartum course. Patient is discharged home 03/03/20.  Newborn Data: Birth date:03/02/2020  Birth time:8:17 AM  Gender:Female  Living status:Living  Apgars:8 ,9  Weight:3600 g   Magnesium Sulfate received: No BMZ received: No Rhophylac:N/A MMR:N/A T-DaP:Given prenatally Flu: Yes Transfusion:No  Physical exam  Vitals:   03/02/20 1616 03/02/20 2001 03/02/20 2347 03/03/20 0540  BP: 101/67 95/61 102/64 96/68  Pulse: (!) 101 88 98 83  Resp: $Remo'19 18 18 18  'BLwpW$ Temp: 97.7 F (36.5 C) 97.9 F (36.6 C) (!) 97.4 F (36.3 C) (!) 97.5 F (36.4 C)  TempSrc: Oral Oral Oral Oral  SpO2:    97%  Weight:      Height:       General: alert, cooperative and no distress Lochia: appropriate Uterine Fundus:  firm Incision: N/A DVT Evaluation: No evidence of DVT seen on physical exam. Labs: Lab Results  Component Value Date   WBC 16.9 (H) 03/03/2020   HGB 9.3 (L) 03/03/2020   HCT 27.9 (L) 03/03/2020   MCV 88.3 03/03/2020   PLT 151 03/03/2020   No flowsheet data found. Edinburgh Score: Edinburgh Postnatal Depression Scale Screening Tool 03/02/2020  I have been able to laugh and see the funny side of things. 0  I have looked forward with enjoyment to things. 0  I have blamed myself unnecessarily when things went wrong. 0  I have been anxious or worried for no good reason. 0  I have felt scared or panicky for no good reason. 0  Things have been getting on top of me. 0  I have been so unhappy that I have had difficulty sleeping. 0  I have felt sad or miserable. 0  I have been so unhappy that I have been crying. 0  The thought of harming myself has occurred to me. 0  Edinburgh Postnatal Depression Scale Total 0     After visit meds:  Allergies as of 03/03/2020   No Known Allergies     Medication List    STOP taking these medications   aspirin 81 MG EC tablet     TAKE these medications   acetaminophen 500 MG tablet Commonly known as:  TYLENOL Take 2 tablets (1,000 mg total) by mouth every 6 (six) hours as needed.   ARIPiprazole 2 MG tablet Commonly known as: ABILIFY Take 2 mg by mouth daily.   ascorbic acid 250 MG tablet Commonly known as: VITAMIN C Take 1 tablet (250 mg total) by mouth every other day. Take with iron to help absorption.   ferrous sulfate 325 (65 FE) MG tablet Take 1 tablet (325 mg total) by mouth every other day.   FLUoxetine 40 MG capsule Commonly known as: PROZAC Take 40 mg by mouth daily.   ibuprofen 600 MG tablet Commonly known as: ADVIL Take 1 tablet (600 mg total) by mouth every 6 (six) hours.   multivitamin-prenatal 27-0.8 MG Tabs tablet Take 1 tablet by mouth daily at 12 noon.        Discharge home in stable condition Infant  Feeding: Bottle and Breast Infant Disposition:home with mother Discharge instruction: per After Visit Summary and Postpartum booklet. Activity: Advance as tolerated. Pelvic rest for 6 weeks.  Diet: routine diet Future Appointments: Future Appointments  Date Time Provider [place]  03/18/2020 10:15 AM [facility]  03/31/2020  1:70 AM Arrie Senate, MD [facility]   Follow up Visit: Message sent to [facility] by Sylvester Harder 03/02/20.   Please schedule this patient for a In person postpartum visit in 4 weeks with the following provider: Any provider. Additional Postpartum F/U:Postpartum Depression checkup in 2 weeks Low risk pregnancy complicated by: n/a Delivery mode:  Vaginal, Spontaneous  Anticipated Birth Control:  PP IUD placed, string check at 4 week postpartum visit   [id number] Arrie Senate, MD

## 2020-03-02 NOTE — Progress Notes (Signed)
Labor Progress Note Candice Downs is a 39 y.o. G4P1021 at [redacted]w[redacted]d presented for eIOL in setting of TOLAC.  S: Doing well without complaints.  O:  BP 104/75   Pulse 93   Temp 98.1 F (36.7 C) (Oral)   Resp 16   Ht 5\' 4"  (1.626 m)   Wt 83.4 kg   LMP 05/24/2019   SpO2 99%   BMI 31.57 kg/m  EFM: baseline 150 bpm (20 min period of 180bpm)/mod variability/+accels/no decels Toco: q2-5 min  CVE: Dilation: 8 Effacement (%): 90 Cervical Position: Posterior Station: 0 Presentation: Vertex Exam by:: Dr. [email or web address]   A&P: 39 y.o. 30 [redacted]w[redacted]d presented for eIOL in setting of TOLAC. #eIOL  TOLAC: Pt progressing well s/p FB. AROM/IUPC for scant clear fluid at 1515. Contractions adequate, will continue pitocin at 20cc/hr per Dr. [redacted]w[redacted]d.  #Pain: epidural #FWB: Category 1 #GBS positive ; PCN started on admission. Now s/p adequate antibiotics. Switched to cefazolin q8 given some burning with infusion. #Anxiety/OCD: continue home abilify and prozac.  [name], MD OB Fellow, Faculty Practice 03/02/2020 12:14 AM

## 2020-03-02 NOTE — Lactation Note (Signed)
This note was copied from a baby's chart. Lactation Consultation Note  Patient Name: Candice Downs BXIDH'W Date: 03/02/2020 Reason for consult: L&D Initial assessment  Age:37 hours   P2, Baby cueing.  Assisted with latching off and on.  Mother formula fed first child and states she will try breastfeeding and decide how to feed her baby depending on how baby does. Lactation to follow up on MBU.   Feeding Feeding Type: Breast Fed  LATCH Score Latch: Repeated attempts needed to sustain latch, nipple held in mouth throughout feeding, stimulation needed to elicit sucking reflex.  Audible Swallowing: A few with stimulation  Type of Nipple: Everted at rest and after stimulation (short shaft)  Comfort (Breast/Nipple): Soft / non-tender  Hold (Positioning): Assistance needed to correctly position infant at breast and maintain latch.  LATCH Score: 7  Interventions Interventions: Assisted with latch;Skin to skin  Lactation Tools Discussed/Used     Consult Status Consult Status: Follow-up Date: 03/02/20 Follow-up type: In-patient    Dahlia Byes [facility] 03/02/2020, 9:05 AM

## 2020-03-02 NOTE — Procedures (Signed)
  Post-Placental IUD Insertion Procedure Note  Patient identified, informed consent signed prior to delivery, signed copy in chart, time out was performed.    Vaginal, labial and perineal areas thoroughly inspected for lacerations. Bilateral deep sulcal lacerations identified - not hemostatic, not repaired prior to insertion of IUD.  Mirena/Liletta/Skyla/Paragard  - IUD grasped between sterile gloved fingers. Sterile lubrication applied to sterile gloved hand for ease of insertion. Fundus identified through abdominal wall using non-insertion hand. IUD inserted to fundus with bimanual technique. IUD carefully released at the fundus and insertion hand gently removed from vagina.    Strings trimmed to the level of the introitus. Patient tolerated procedure well.  Patient given post procedure instructions and IUD care card with expiration date.  Patient is asked to keep IUD strings tucked in her vagina until her postpartum follow up visit in 4-6 weeks. Patient advised to abstain from sexual intercourse and pulling on strings before her follow-up visit. Patient verbalized an understanding of the plan of care and agrees.   Alric Seton, MD OB Fellow, Faculty [facility], Center for [facility] 03/02/2020 8:50 AM

## 2020-03-03 ENCOUNTER — Encounter (HOSPITAL_COMMUNITY): Payer: Self-pay | Admitting: Family Medicine

## 2020-03-03 DIAGNOSIS — D62 Acute posthemorrhagic anemia: Secondary | ICD-10-CM | POA: Diagnosis not present

## 2020-03-03 LAB — CBC
HCT: 27.9 % — ABNORMAL LOW (ref 36.0–46.0)
Hemoglobin: 9.3 g/dL — ABNORMAL LOW (ref 12.0–15.0)
MCH: 29.4 pg (ref 26.0–34.0)
MCHC: 33.3 g/dL (ref 30.0–36.0)
MCV: 88.3 fL (ref 80.0–100.0)
Platelets: 151 10*3/uL (ref 150–400)
RBC: 3.16 MIL/uL — ABNORMAL LOW (ref 3.87–5.11)
RDW: 14.8 % (ref 11.5–15.5)
WBC: 16.9 10*3/uL — ABNORMAL HIGH (ref 4.0–10.5)
nRBC: 0 % (ref 0.0–0.2)

## 2020-03-03 MED ORDER — FERROUS SULFATE 325 (65 FE) MG PO TABS
325.0000 mg | ORAL_TABLET | ORAL | Status: DC
  Administered 2020-03-03: 11:00:00 325 mg via ORAL
  Filled 2020-03-03: qty 1

## 2020-03-03 MED ORDER — VITAMIN C 250 MG PO TABS
250.0000 mg | ORAL_TABLET | ORAL | Status: DC
  Administered 2020-03-03: 11:00:00 250 mg via ORAL
  Filled 2020-03-03: qty 1

## 2020-03-03 MED ORDER — ACETAMINOPHEN 500 MG PO TABS: 1000.0000 mg | ORAL_TABLET | Freq: Four times a day (QID) | ORAL | Status: DC | PRN

## 2020-03-03 MED ORDER — FERROUS SULFATE 325 (65 FE) MG PO TABS: 325.0000 mg | ORAL_TABLET | ORAL | 3 refills | Status: DC

## 2020-03-03 MED ORDER — ASCORBIC ACID 250 MG PO TABS: 250.0000 mg | ORAL_TABLET | ORAL | Status: DC

## 2020-03-03 MED ORDER — IBUPROFEN 600 MG PO TABS: 600.0000 mg | ORAL_TABLET | Freq: Four times a day (QID) | ORAL | 0 refills | Status: DC

## 2020-03-10 NOTE — BH Specialist Note (Signed)
Integrated Behavioral Health via Telemedicine Visit  03/10/2020 Candice Downs [id number]  Number of Integrated Behavioral Health visits: 1 Session Start time: 10:18  Session End time: 10:43  Total time: 25  Referring Provider: Mart Piggs, MD Patient/Family location: Home [facility] Provider location: [facility]  All persons participating in visit: Patient Candice Downs and Candice Downs   Types of Service: Telephone visit  I connected with Candice Downs and/or Candice Downs n/a by Telephone  (Video is Caregility application) and verified that I am speaking with the correct person using two identifiers.Discussed confidentiality: Yes   I discussed the limitations of telemedicine and the availability of in person appointments.  Discussed there is a possibility of technology failure and discussed alternative modes of communication if that failure occurs.  I discussed that engaging in this telemedicine visit, they consent to the provision of behavioral healthcare and the services will be billed under their insurance.  Patient and/or legal guardian expressed understanding and consented to Telemedicine visit: Yes   Presenting Concerns: Patient and/or family reports the following symptoms/concerns: Pt states her primary concern today is needing refill of Prozac (has 3 month supply of Abilify, so no need for this refill); on these meds for at least 3 years; moved recently from [place] and uncertain how quickly she will be seeing local PCP to take over [facility] medication management; pt's only symptom is mild fatigue, and managing well on medication.  Duration of problem: Ongoing; Severity of problem: mild  Patient and/or Family's Strengths/Protective Factors: Social connections, Social and Emotional competence, Concrete supports in place (healthy food, safe environments, etc.), Sense of purpose and Physical Health (exercise, healthy diet, medication  compliance, etc.)  Goals Addressed: Patient will: 1.  Reduce symptoms of: anxiety  2.  Increase knowledge and/or ability of: healthy habits  3.  Demonstrate ability to: Increase healthy adjustment to current life circumstances  Progress towards Goals: Ongoing  Interventions: Interventions utilized:  Medication Monitoring, Psychoeducation and/or Health Education and Link to [employer] Standardized Assessments completed: GAD-7 and PHQ 9  Patient and/or Family Response: Pt agrees to treatment plan  Assessment: Patient currently experiencing Anxiety disorder and Obsessive-Compulsive disorder (previously diagnosed)  Patient may benefit from psychoeducation and brief therapeutic interventions regarding maintaining reducation of symptoms of anxiety .  Plan: 1. Follow up with behavioral health clinician on : Call Muneeb Veras at [phone] as needed 2. Behavioral recommendations:  -Continue taking BH medication as prescribed -Talk to PCP about taking over [facility] medications  -Consider local options for [facility] outpatient therapy and BH medication management services as needed (on After Visit Summary) 3. Referral(s): Integrated Art gallery manager (In Clinic) and [employer]:  New mom support  I discussed the assessment and treatment plan with the patient and/or parent/guardian. They were provided an opportunity to ask questions and all were answered. They agreed with the plan and demonstrated an understanding of the instructions.   They were advised to call back or seek an in-person evaluation if the symptoms worsen or if the condition fails to improve as anticipated.  Rae Lips, LCSW   Depression screen [facility] 2/9 03/18/2020 02/27/2020 02/13/2020 02/06/2020 01/22/2020  Decreased Interest 0 0 0 0 0  Down, Depressed, Hopeless 0 0 0 0 0  PHQ - 2 Score 0 0 0 0 0  Altered sleeping 0 0 0 0 0  Tired, decreased energy 1 0 0 0 0  Change in appetite 0 0 0 0 0  Feeling  bad or  failure about yourself  0 0 0 0 0  Trouble concentrating 0 0 0 0 0  Moving slowly or fidgety/restless 0 0 0 0 0  Suicidal thoughts 0 0 0 0 0  PHQ-9 Score 1 0 0 0 0   GAD 7 : Generalized Anxiety Score 03/18/2020 02/27/2020 02/13/2020 02/06/2020  Nervous, Anxious, on Edge 0 0 0 0  Control/stop worrying 0 0 0 0  Worry too much - different things 0 0 0 0  Trouble relaxing 0 0 0 0  Restless 0 0 0 0  Easily annoyed or irritable 0 0 0 0  Afraid - awful might happen 0 0 0 0  Total GAD 7 Score 0 0 0 0

## 2020-03-18 ENCOUNTER — Other Ambulatory Visit: Payer: Self-pay

## 2020-03-18 ENCOUNTER — Ambulatory Visit: Payer: Medicaid Other | Admitting: Clinical

## 2020-03-18 DIAGNOSIS — F429 Obsessive-compulsive disorder, unspecified: Secondary | ICD-10-CM

## 2020-03-18 DIAGNOSIS — F419 Anxiety disorder, unspecified: Secondary | ICD-10-CM

## 2020-03-18 NOTE — Patient Instructions (Signed)
Center for [facility] at [facility] [address] [place], [place] [address] [phone] (main office) [phone] (Jovaun Levene's office)  New Mom Support Groups Online:  [email or web address]

## 2020-03-30 ENCOUNTER — Encounter: Payer: Self-pay | Admitting: Nurse Practitioner

## 2020-03-31 ENCOUNTER — Encounter: Payer: Self-pay | Admitting: General Practice

## 2020-03-31 ENCOUNTER — Ambulatory Visit: Payer: Medicaid Other | Admitting: Family Medicine

## 2020-03-31 ENCOUNTER — Ambulatory Visit (INDEPENDENT_AMBULATORY_CARE_PROVIDER_SITE_OTHER): Payer: Medicaid Other | Admitting: Nurse Practitioner

## 2020-03-31 ENCOUNTER — Other Ambulatory Visit: Payer: Self-pay

## 2020-03-31 ENCOUNTER — Encounter: Payer: Self-pay | Admitting: Nurse Practitioner

## 2020-03-31 DIAGNOSIS — Z98891 History of uterine scar from previous surgery: Secondary | ICD-10-CM

## 2020-03-31 DIAGNOSIS — F419 Anxiety disorder, unspecified: Secondary | ICD-10-CM

## 2020-03-31 NOTE — Progress Notes (Signed)
    Post Partum Visit Note  Candice Downs is a 40 y.o. [phone] female who presents for a postpartum visit. She is 4 weeks postpartum following a normal spontaneous vaginal delivery - VBAC.  I have fully reviewed the prenatal and intrapartum course. The delivery was at 40.3 gestational weeks.  Anesthesia: epidural. Postpartum course has been. Baby is doing well. Baby is feeding by bottle Rush Barer. Bleeding no bleeding. Bowel function is normal. Bladder function is normal. Patient is not sexually active. Contraception method is IUD. Postpartum depression screening: negative.   The pregnancy intention screening data noted above was reviewed. Potential methods of contraception were discussed.Has IUD placed after delivery.  States IUD strings are out. IUD or IUS.      The following portions of the patient's history were reviewed and updated as appropriate: allergies, current medications, past family history, past medical history, past social history, past surgical history and problem list.  Review of Systems Pertinent items noted in HPI and remainder of comprehensive ROS otherwise negative.    Objective:   Vitals:   03/31/20 1554  BP: 122/80  Pulse: 86     General:  alert, cooperative and no distress   Breasts:  not examined  Lungs: clear to auscultation bilaterally  Heart:  regular rate and rhythm, S1, S2 normal, no murmur, click, rub or gallop  Abdomen: soft nontender   Vulva:  normal  IUD strings seen and trimmed, at 6 o'clock 2 stitches seen with mild tenderness  Vagina: normal vagina  Cervix:  iud strings seen and trimmed to 3 cm in length, no part of IUD palpated on bimanual exam  Corpus: normal  Adnexa:  normal adnexa  Rectal Exam: external skin tags noted - no inflammation        Assessment:    Normal postpartum exam. Pap smear not done at today's visit.   Plan:   Essential components of care per ACOG recommendations:  1.  Mood and well being: Patient with negative  depression screening today. Reviewed local resources for support.  - Patient does not use tobacco. - hx of drug use? No    2. Infant care and feeding:  -Patient currently breastmilk feeding? No  -Social determinants of health (SDOH) reviewed in EPIC. No concerns  3. Sexuality, contraception and birth spacing - Patient does not want a pregnancy in the next year.   - Reviewed forms of contraception in tiered fashion. Patient desired IUD today.  Already in place - Discussed birth spacing of 18 months  4. Sleep and fatigue -Encouraged family/partner/community support of 4 hrs of uninterrupted sleep to help with mood and fatigue  5. Physical Recovery  - Discussed patients delivery and complications - Patient had a sulcus laceration, perineal healing reviewed. Patient expressed understanding Full healing has not completed.   - Patient has urinary incontinence? No - Patient is safe to resume physical and sexual activity after 2 weeks to allow more time for healing at introitus.  6.  Health Maintenance - Last pap smear done  and was normal with negative HPV.   7. Chronic Disease - PCP follow up - will be going to [facility] for her management of her current medications.  Marylynn Pearson, RN Center for [employer], [facility]  Nolene Bernheim, RN, MSN, NP-BC Nurse Practitioner, Biochemist, clinical for [employer], [facility] 03/31/2020 5:08 PM

## 2020-05-25 NOTE — Progress Notes (Signed)
    SUBJECTIVE:   CHIEF COMPLAINT / HPI: Establish care  No specific concerns today, needs medications refilled.  Social history Unemployed, has completed some college Lives with husband [place], sons [place] and [place], 2 cats and a dog Starting to walk regularly for exercise, 30 minutes at a time Try to eat healthy and not eat too many snacks.  Mostly drinks water and avoids juice and soda Denies alcohol use. Former smoker, quit in 2018.  Smoked 0.5 ppd since the age of 42 Feels safe in relationship Enjoys spending time with family Has not seen a dentist in some time, does not have dental insurance currently.  Brushes teeth twice daily but does not floss regularly. Wears glasses and sees an optometrist regularly for eye exams yearly  Anxiety, OCD Patient is requesting refills of her aripiprazole and fluoxetine She has been stable on these medications for the past 4 to 5 years She was previously seeing psychiatry, but has not been seeing them recently.  Medications have been managed by her formal PCP Used to see a therapist, but not recently She feels her mood has been pretty good overall and does not feel the need to see a therapist at this time Denies SI.  No history of psychiatric hospitalizations  PERTINENT  PMH / PSH: anxiety, OCD, former smoker  OBJECTIVE:   BP 102/72   Pulse 75   Wt 155 lb 9.6 oz (70.6 kg)   SpO2 97%   BMI 26.71 kg/m   General: Well-appearing young female, NAD Eyes: PERRL, EOMI HEENT: multiple dental caries Neck: supple, no thyromegaly CV: RRR, no murmurs Pulm: CTAB, no wheezes or rales Abd: soft, non-tender, +BS  ASSESSMENT/PLAN:   OCD (obsessive compulsive disorder) Stable on antipsychotic and SSRI for the past 4 to 5 years.  Has been managed by former PCP.  Aripiprazole and fluoxetine refilled.  Will obtain monitoring labs today (CMP, TSH, lipid panel, HbA1c).  Therapy resources provided should she feel the need to start therapy again.  Poor  dentition Multiple caries noted.  Dental resources given, encouraged patient to floss daily.  HCM - Covid booster declined  Littie Deeds, MD [facility]

## 2020-05-25 NOTE — Patient Instructions (Incomplete)
It was nice seeing you today!  Try to get in with a dentist if you can. Remember to floss daily.  Follow-up in 1 year for routine physical or sooner if needed.  Please arrive at least 15 minutes prior to your scheduled appointments.  Stay well, Candice Deeds, MD [facility] [phone]    Therapy and Counseling Resources Most providers on this list will take Medicaid. Patients with commercial insurance or Medicare should contact their insurance company to get a list of in network providers.  [facility]iatry [facility] Counseling [address] [facility] [address] [place], [place] [address] [phone]  [facility]  [address], [address] [place], [place] [address]      [phone]  [facility] [address]  [place], [place] [address] [phone]  [facility] [address], [address]  [place], [place] [address]       [phone]      Jovita Kussmaul [facility] [address] [place], [place] [phone]  [facility]:  [address] [place] [phone]  [facility]:  [address] [place] [phone]  [facility] (under & uninsured) [address]   [place] [phone]    [email or web address]    [facility] [address] . [place]    [phone]  [facility] [place] -[address]     Phone:  [phone]     [facility]-  [address]  [phone]   [facility] [address]      [place], [place] [phone]  [facility]: [address] [place], [place], [place]  [phone]   [facility] (Spanish therapist) [email or web address]  [address] [place]  [address]   [place] [address]    [phone]    [facility]   [address],  [place], [place]  [phone]    [facility]  [address] [place]  [phone]  [facility]  [address] [place], [place]        [phone]  Open Access/Walk In Clinic under & uninsured  [facility]  [address] [place], [place] [phone] Crisis [phone]  [facility] [place],  (Spanish)   [address], [place]: [phone]) 8:30 - 12; 1 - 2:30  [facility] [employer],  [address] [place], [place]    [phone]):8:30 - 12; 2 - 3PM  [facility] [employer],  [address],  [place]; [phone]):   Mon - Fri 8 AM - 5 PM  [facility] [address] [place]  MWF 12:30 to 3:00 or call to schedule an appointment  [phone]  Specific Provider options Psychology Today  [email or web address]/us 1. click on find a therapist  2. enter your zip code 3. left side and select or tailor a therapist for your specific need.   [facility] Provider Directory [email or web address]  (Medicaid)   Follow all drop down to find a provider  Social Support program [facility] [place] [phone] or [employer] [address], [place], [place] Recovery support and educational   24- Hour Availability:  .  Marland Kitchen [facility]  . [address] [place], [place] [employer] [phone] Crisis [phone]  . [facility] [phone]  [facility] Crisis Service  [phone]   . [facility] [employer]  [phone] (after hours)  . [facility]/Mobile Crisis   [phone]  . [place] National Suicide Hotline  [phone] (TALK)  . Call 911 or go to emergency room  . [employer]  [phone]);  [place] [facility] [employer]   . [facility]  [phone]); [place], [place], [place], [place], [place], [place], [place]

## 2020-05-26 ENCOUNTER — Encounter: Payer: Self-pay | Admitting: Family Medicine

## 2020-05-26 ENCOUNTER — Ambulatory Visit (INDEPENDENT_AMBULATORY_CARE_PROVIDER_SITE_OTHER): Payer: Medicaid Other | Admitting: Family Medicine

## 2020-05-26 ENCOUNTER — Other Ambulatory Visit: Payer: Self-pay

## 2020-05-26 VITALS — BP 102/72 | HR 75 | Wt 155.6 lb

## 2020-05-26 DIAGNOSIS — Z79899 Other long term (current) drug therapy: Secondary | ICD-10-CM | POA: Diagnosis not present

## 2020-05-26 DIAGNOSIS — Z7689 Persons encountering health services in other specified circumstances: Secondary | ICD-10-CM | POA: Diagnosis not present

## 2020-05-26 DIAGNOSIS — F419 Anxiety disorder, unspecified: Secondary | ICD-10-CM

## 2020-05-26 DIAGNOSIS — F429 Obsessive-compulsive disorder, unspecified: Secondary | ICD-10-CM | POA: Diagnosis not present

## 2020-05-26 MED ORDER — ARIPIPRAZOLE 2 MG PO TABS: 2.0000 mg | ORAL_TABLET | Freq: Every day | ORAL | 3 refills | Status: DC

## 2020-05-26 MED ORDER — FLUOXETINE HCL 40 MG PO CAPS: 40.0000 mg | ORAL_CAPSULE | Freq: Every day | ORAL | 3 refills | Status: DC

## 2020-05-26 NOTE — Assessment & Plan Note (Addendum)
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  Has been managed by former PCP.  Aripiprazole and fluoxetine refilled.  Will obtain monitoring labs today (CMP, TSH, lipid panel, HbA1c).  Therapy resources provided should she feel the need to start therapy again.

## 2020-05-27 LAB — HEMOGLOBIN A1C
Est. average glucose Bld gHb Est-mCnc: 100 mg/dL
Hgb A1c MFr Bld: 5.1 % (ref 4.8–5.6)

## 2020-05-27 LAB — LIPID PANEL
Chol/HDL Ratio: 2.6 ratio (ref 0.0–4.4)
Cholesterol, Total: 174 mg/dL (ref 100–199)
HDL: 68 mg/dL (ref >39)
LDL Chol Calc (NIH): 83 mg/dL (ref 0–99)
Triglycerides: 135 mg/dL (ref 0–149)
VLDL Cholesterol Cal: 23 mg/dL (ref 5–40)

## 2020-05-27 LAB — COMPREHENSIVE METABOLIC PANEL
ALT: 105 IU/L — ABNORMAL HIGH (ref 0–32)
AST: 62 IU/L — ABNORMAL HIGH (ref 0–40)
Albumin/Globulin Ratio: 2.1 (ref 1.2–2.2)
Albumin: 4.7 g/dL (ref 3.8–4.8)
Alkaline Phosphatase: 92 IU/L (ref 44–121)
BUN/Creatinine Ratio: 12 (ref 9–23)
BUN: 12 mg/dL (ref 6–20)
Bilirubin Total: 0.7 mg/dL (ref 0.0–1.2)
CO2: 22 mmol/L (ref 20–29)
Calcium: 9.7 mg/dL (ref 8.7–10.2)
Chloride: 101 mmol/L (ref 96–106)
Creatinine, Ser: 0.99 mg/dL (ref 0.57–1.00)
Globulin, Total: 2.2 g/dL (ref 1.5–4.5)
Glucose: 85 mg/dL (ref 65–99)
Potassium: 3.7 mmol/L (ref 3.5–5.2)
Sodium: 141 mmol/L (ref 134–144)
Total Protein: 6.9 g/dL (ref 6.0–8.5)
eGFR: 75 mL/min/{1.73_m2} (ref >59)

## 2020-05-27 LAB — TSH: TSH: 3.95 u[IU]/mL (ref 0.450–4.500)

## 2020-07-02 ENCOUNTER — Ambulatory Visit: Payer: Medicaid Other | Admitting: Family Medicine

## 2020-09-18 ENCOUNTER — Other Ambulatory Visit: Payer: Self-pay

## 2020-09-18 MED ORDER — ARIPIPRAZOLE 2 MG PO TABS: 2.0000 mg | ORAL_TABLET | Freq: Every day | ORAL | 3 refills | Status: AC

## 2020-11-02 ENCOUNTER — Telehealth: Payer: Self-pay

## 2020-11-02 NOTE — Telephone Encounter (Signed)
Received determination from insurance company. Medication was approved until August 29,2023.  Called pharmacy with approval. Called patient, no answer, left VM informing of above.   Veronda Prude, RN

## 2020-11-02 NOTE — Telephone Encounter (Signed)
Received fax from pharmacy, PA needed on Abilify.  Clinical questions submitted via Cover My Meds.  Waiting on response, could take up to 72 hours.  Cover My Meds info: Key: [id number]  Veronda Prude, RN

## 2021-06-21 ENCOUNTER — Other Ambulatory Visit: Payer: Self-pay | Admitting: Family Medicine

## 2021-08-10 ENCOUNTER — Encounter: Payer: Self-pay | Admitting: *Deleted

## 2022-03-08 IMAGING — US US MFM OB DETAIL+14 WK
1 series · 13 of 28 positions shown · non-contrast
Comparison: none

[Series 1: us mfm ob detail+14 wk · 76 acquisitions, 13 frames shown]
[im 3/76]
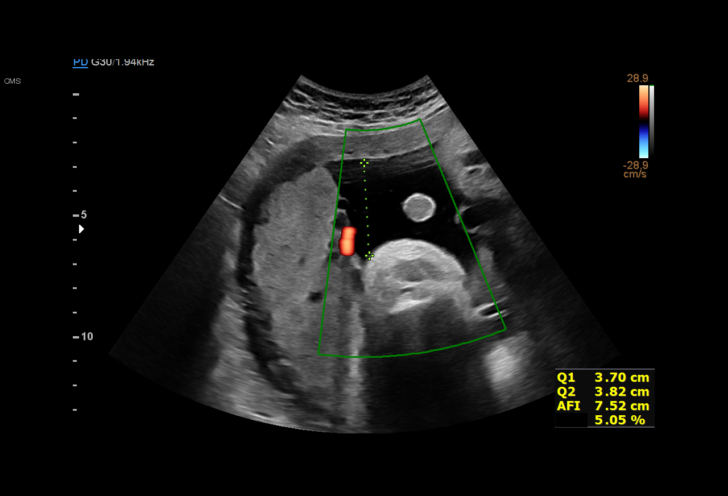
[im 9/76]
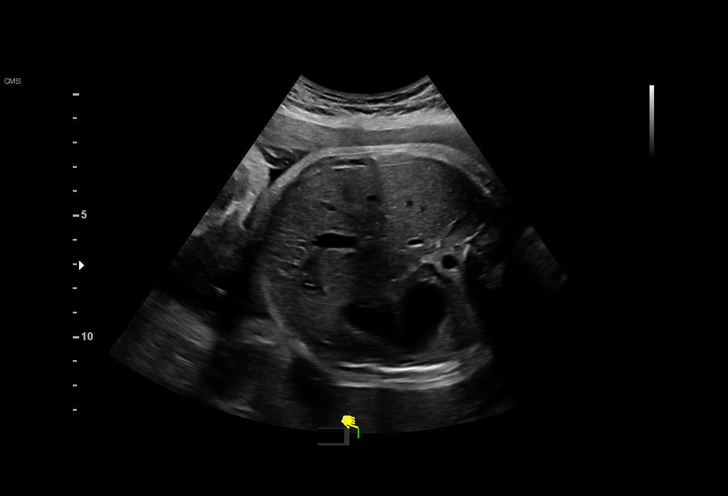
[im 14/76]
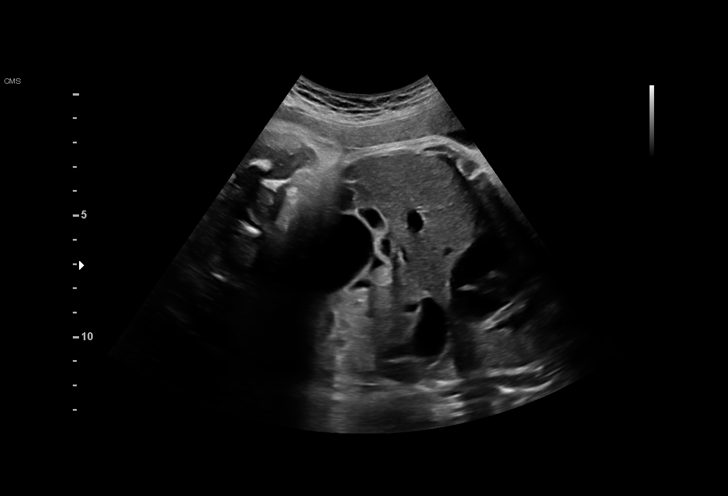
[im 20/76]
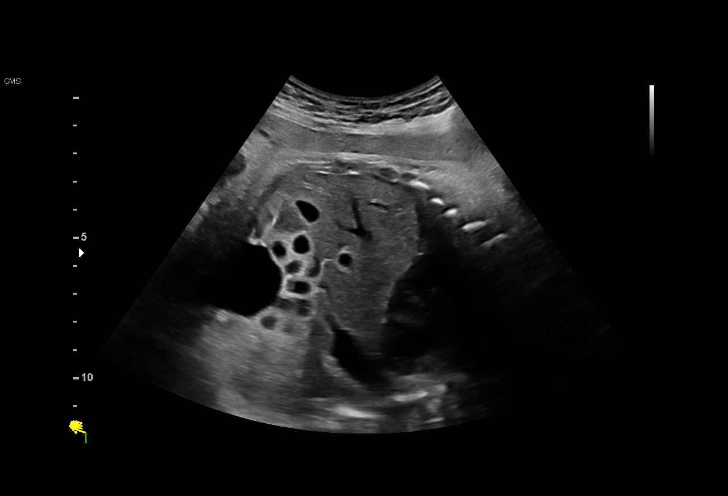
[im 26/76]
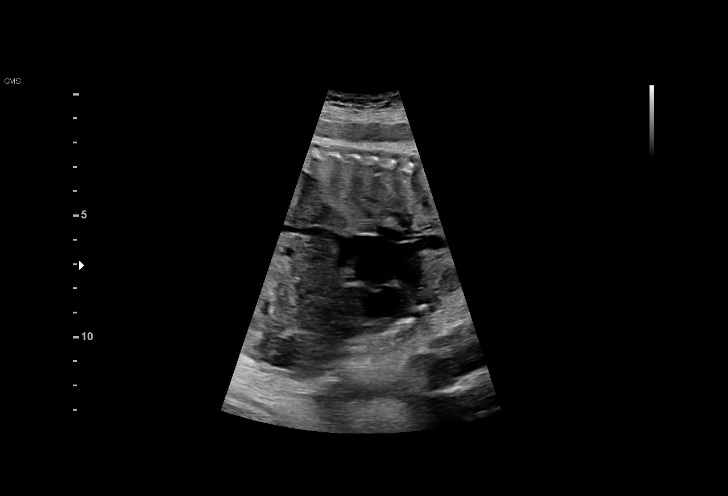
[im 31/76]
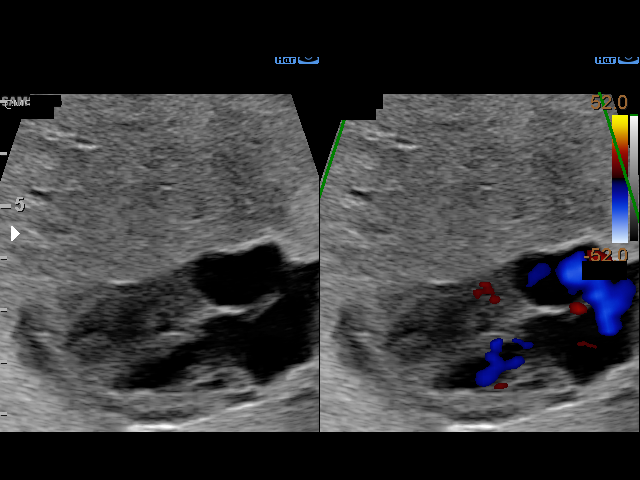
[im 39/76]
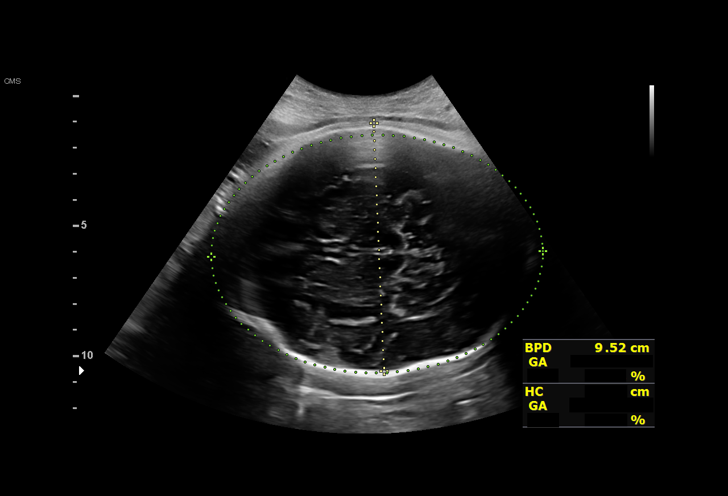
[im 45/76]
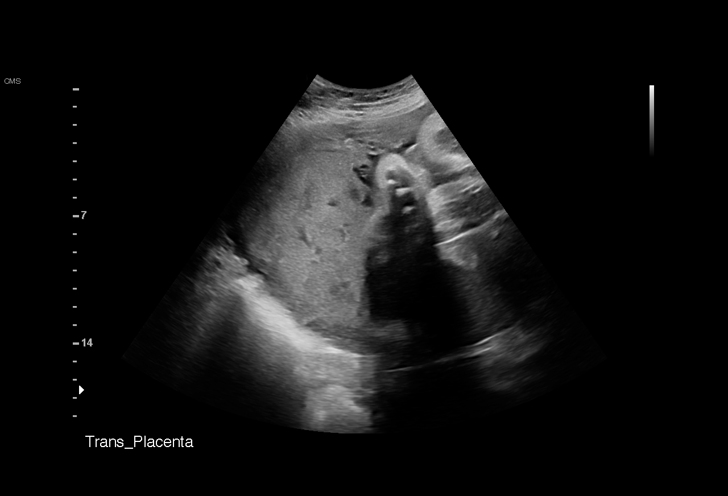
[im 51/76]
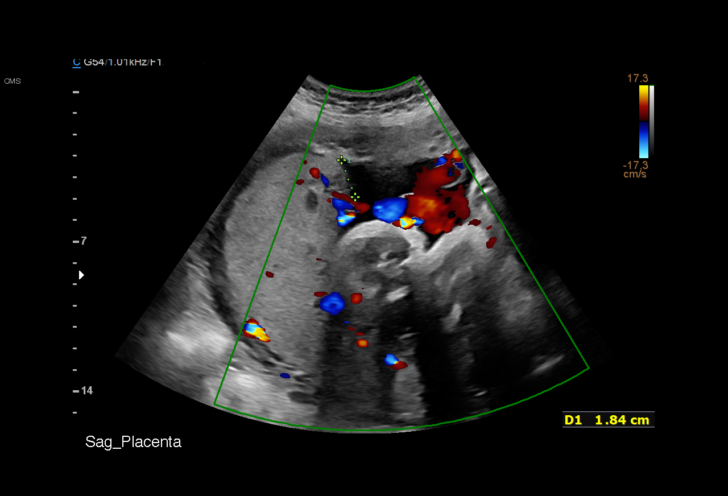
[im 56/76]
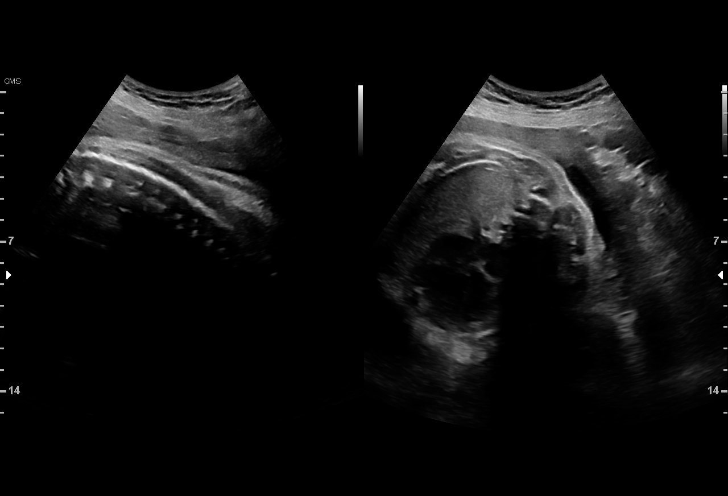
[im 62/76]
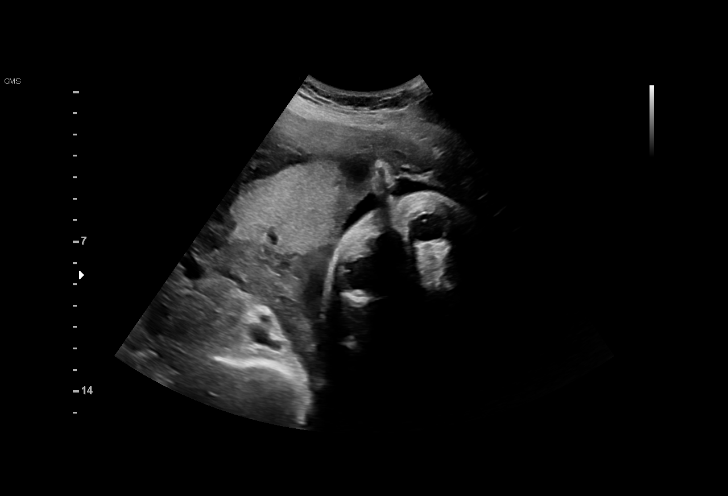
[im 67/76]
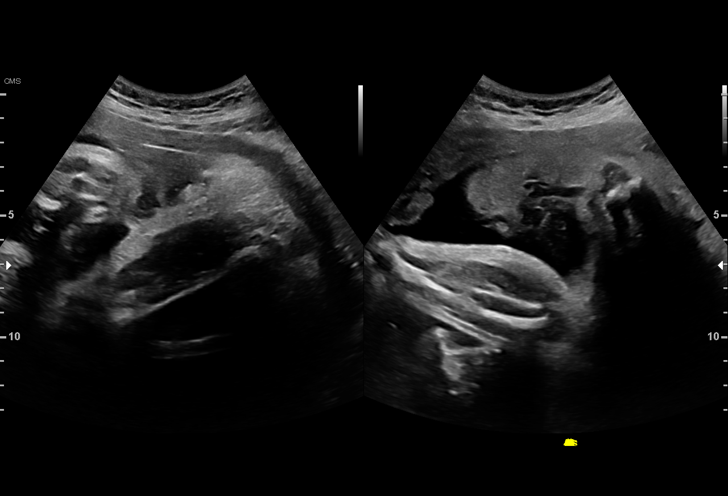
[im 73/76]
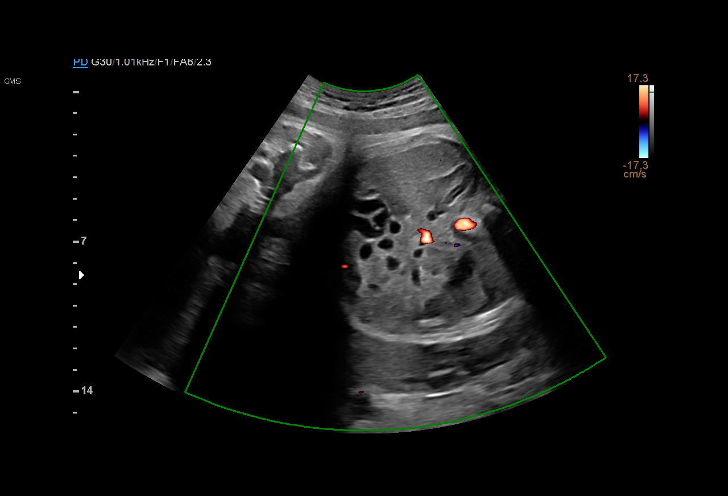

[13 of 28 positions shown; findings below may reference images not displayed]

Indications

 Antenatal screening for malformations
 Advanced maternal age multigravida 35+,
 third trimester (low risk NIPS, neg AFP)
 Short interval between pregancies, 3rd
 trimester
 Previous cesarean delivery, antepartum
 37 weeks gestation of pregnancy
Fetal Evaluation

 Num Of Fetuses:         1
 Fetal Heart Rate(bpm):  138
 Cardiac Activity:       Observed
 Presentation:           Cephalic
 Placenta:               Posterior
 P. Cord Insertion:      Not well visualized

 AFI Sum(cm)     %Tile       Largest Pocket(cm)
 8.7             14

 RUQ(cm)                     LUQ(cm)        LLQ(cm)

Biometry

 BPD:      95.5  mm     G. Age:  39w 0d         97  %    CI:         76.2   %    70 - 86
                                                         FL/HC:      20.2   %    20.8 -
 HC:      346.7  mm     G. Age:  40w 1d         91  %    HC/AC:      1.01        0.92 -
 AC:      342.4  mm     G. Age:  38w 1d         89  %    FL/BPD:     73.5   %    71 - 87
 FL:       70.2  mm     G. Age:  36w 0d         24  %    FL/AC:      20.5   %    20 - 24
 HUM:      60.9  mm     G. Age:  35w 2d         38  %
 CER:      51.5  mm     G. Age:  37w 2d         53  %

 LV:          4  mm

 Est. FW:    4438  gm      7 lb 7 oz     81  %
OB History

 Gravidity:    4         Term:   1        Prem:   0        SAB:   2
 TOP:          0       Ectopic:  0        Living: 1
Gestational Age

 LMP:           37w 0d        Date:  05/24/19                 EDD:   02/28/20
 U/S Today:     38w 2d                                        EDD:   02/19/20
 Best:          37w 0d     Det. By:  LMP  (05/24/19)          EDD:   02/28/20
Anatomy

 Cranium:               Appears normal         LVOT:                   Appears normal
 Cavum:                 Appears normal         Aortic Arch:            Not well visualized
 Ventricles:            Appears normal         Ductal Arch:            Not well visualized
 Choroid Plexus:        Appears normal         Diaphragm:              Appears normal
 Cerebellum:            Appears normal         Stomach:                Appears normal, left
                                                                       sided
 Posterior Fossa:       Appears normal         Abdomen:                Appears normal
 Nuchal Fold:           Not applicable (>20    Abdominal Wall:         Appears nml (cord
                        wks GA)                                        insert, abd wall)
 Face:                  Appears normal         Cord Vessels:           Appears normal (3
                        (orbits and profile)                           vessel cord)
 Lips:                  Appears normal         Kidneys:                Appear normal
 Palate:                Not well visualized    Bladder:                Appears normal
 Thoracic:              Appears normal         Spine:                  Limited views
                                                                       appear normal
 Heart:                 Appears normal         Upper Extremities:      Visualized
                        (4CH, axis, and
                        situs)
 RVOT:                  Appears normal         Lower Extremities:      Appears normal

 Other:  VC, 3VV and 3VTV visualized.  Nasal bone visualized. Hands and
         feet not well visualized. Technically difficult due to advanced
         gestational age.
Cervix Uterus Adnexa

 Cervix
 Not visualized (advanced GA >84wks)

 Uterus
 No abnormality visualized.

 Right Ovary
 Not visualized.

 Left Ovary
 Not visualized.
 Cul De Sac
 No free fluid seen.

 Adnexa
 No abnormality visualized.
Impression

 G4 P1. Patient recently moved from [place].  She
 had prenatal care there that was uneventful.  Screening for
 aneuploidies is reportedly negative.  Patient does not have
 gestational diabetes.  Her blood pressures have been normal
 at prenatal visits.  Blood pressure today at her office is
 125/78 mmHg.
 Her EDD was established by LMP date consistent with early
 ultrasound.
 Static history significant for term cesarean delivery (breech
 presentation).

 On today's ultrasound, amniotic fluid is normal and good fetal
 activity seen.  Fetal growth is appropriate for gestational age.
 Fetal anatomy appears normal but limited by advanced
 gestational age.  Cephalic presentation.  Placenta is posterior
 and there is no evidence of previa or accreta.
Recommendations

 No follow-up appointments were ma[REDACTED]

## 2022-03-18 ENCOUNTER — Other Ambulatory Visit: Payer: Self-pay | Admitting: Family Medicine

## 2022-04-13 ENCOUNTER — Other Ambulatory Visit: Payer: Self-pay | Admitting: Family Medicine

## 2022-05-09 ENCOUNTER — Other Ambulatory Visit: Payer: Self-pay | Admitting: Family Medicine
